# Patient Record
Sex: Male | Born: 1965 | Race: Black or African American | Hispanic: No | Marital: Married | Smoking: Current some day smoker
Health system: Southern US, Community
[De-identification: ages and names within clinical notes are randomized; demographics above are authoritative.]

## PROBLEM LIST (undated history)

## (undated) DIAGNOSIS — I10 Essential (primary) hypertension: Secondary | ICD-10-CM

## (undated) HISTORY — PX: OTHER SURGICAL HISTORY: SHX169

---

## 2004-09-13 ENCOUNTER — Emergency Department (HOSPITAL_COMMUNITY): Admission: EM | Admit: 2004-09-13 | Discharge: 2004-09-14 | Payer: Self-pay | Admitting: Emergency Medicine

## 2005-08-30 ENCOUNTER — Emergency Department (HOSPITAL_COMMUNITY): Admission: EM | Admit: 2005-08-30 | Discharge: 2005-08-30 | Payer: Self-pay | Admitting: Family Medicine

## 2006-09-12 ENCOUNTER — Emergency Department (HOSPITAL_COMMUNITY): Admission: EM | Admit: 2006-09-12 | Discharge: 2006-09-12 | Payer: Self-pay | Admitting: *Deleted

## 2007-08-06 ENCOUNTER — Emergency Department (HOSPITAL_COMMUNITY): Admission: EM | Admit: 2007-08-06 | Discharge: 2007-08-06 | Payer: Self-pay | Admitting: Emergency Medicine

## 2008-04-27 ENCOUNTER — Emergency Department (HOSPITAL_COMMUNITY): Admission: EM | Admit: 2008-04-27 | Discharge: 2008-04-27 | Payer: Self-pay | Admitting: Internal Medicine

## 2010-05-25 LAB — WOUND CULTURE

## 2010-10-11 ENCOUNTER — Emergency Department (HOSPITAL_COMMUNITY)
Admission: EM | Admit: 2010-10-11 | Discharge: 2010-10-11 | Discharge status: Against Medical Advice | Payer: Self-pay | Attending: Emergency Medicine | Admitting: Emergency Medicine

## 2010-10-11 ENCOUNTER — Emergency Department (HOSPITAL_COMMUNITY): Payer: Self-pay

## 2010-10-11 DIAGNOSIS — R0789 Other chest pain: Secondary | ICD-10-CM | POA: Insufficient documentation

## 2010-10-11 DIAGNOSIS — R0602 Shortness of breath: Secondary | ICD-10-CM | POA: Insufficient documentation

## 2010-10-11 DIAGNOSIS — R252 Cramp and spasm: Secondary | ICD-10-CM | POA: Insufficient documentation

## 2010-10-11 LAB — CBC
Hemoglobin: 14.2 g/dL (ref 13.0–17.0)
MCH: 31.4 pg (ref 26.0–34.0)
MCV: 92.3 fL (ref 78.0–100.0)
RBC: 4.52 MIL/uL (ref 4.22–5.81)
WBC: 13.6 10*3/uL — ABNORMAL HIGH (ref 4.0–10.5)

## 2010-10-11 LAB — CK TOTAL AND CKMB (NOT AT ARMC)
CK, MB: 7.8 ng/mL (ref 0.3–4.0)
Total CK: 557 U/L — ABNORMAL HIGH (ref 7–232)

## 2010-10-11 LAB — POCT I-STAT, CHEM 8
BUN: 12 mg/dL (ref 6–23)
Chloride: 105 mEq/L (ref 96–112)
Potassium: 3.6 mEq/L (ref 3.5–5.1)
Sodium: 140 mEq/L (ref 135–145)

## 2010-10-11 LAB — MAGNESIUM: Magnesium: 2.1 mg/dL (ref 1.5–2.5)

## 2010-10-11 LAB — DIFFERENTIAL
Lymphocytes Relative: 17 % (ref 12–46)
Lymphs Abs: 2.3 10*3/uL (ref 0.7–4.0)
Monocytes Relative: 6 % (ref 3–12)
Neutro Abs: 10.3 10*3/uL — ABNORMAL HIGH (ref 1.7–7.7)
Neutrophils Relative %: 76 % (ref 43–77)

## 2010-10-11 LAB — RAPID URINE DRUG SCREEN, HOSP PERFORMED
Barbiturates: NOT DETECTED
Cocaine: NOT DETECTED
Opiates: NOT DETECTED

## 2010-11-09 LAB — POCT I-STAT, CHEM 8
BUN: 21
Calcium, Ion: 1.18
Chloride: 106
Glucose, Bld: 107 — ABNORMAL HIGH
HCT: 48
Potassium: 3.7

## 2010-11-09 LAB — URINALYSIS, ROUTINE W REFLEX MICROSCOPIC
Nitrite: NEGATIVE
Specific Gravity, Urine: 1.026
Urobilinogen, UA: 0.2
pH: 5

## 2010-11-09 LAB — URINE MICROSCOPIC-ADD ON

## 2010-11-09 LAB — CK: Total CK: 422 — ABNORMAL HIGH

## 2010-11-27 LAB — I-STAT 8, (EC8 V) (CONVERTED LAB)
Acid-base deficit: 2
BUN: 24 — ABNORMAL HIGH
Chloride: 107
Glucose, Bld: 112 — ABNORMAL HIGH
Potassium: 4.2
pCO2, Ven: 46.4
pH, Ven: 7.329 — ABNORMAL HIGH

## 2010-11-27 LAB — URINALYSIS, ROUTINE W REFLEX MICROSCOPIC
Bilirubin Urine: NEGATIVE
Leukocytes, UA: NEGATIVE
Nitrite: NEGATIVE
Specific Gravity, Urine: 1.034 — ABNORMAL HIGH
Urobilinogen, UA: 0.2
pH: 5

## 2010-11-27 LAB — POCT I-STAT CREATININE: Creatinine, Ser: 2.2 — ABNORMAL HIGH

## 2010-11-27 LAB — CK: Total CK: 673 — ABNORMAL HIGH

## 2010-11-27 LAB — URINE MICROSCOPIC-ADD ON

## 2012-11-06 ENCOUNTER — Emergency Department (HOSPITAL_COMMUNITY): Payer: Self-pay

## 2012-11-06 ENCOUNTER — Encounter (HOSPITAL_COMMUNITY): Payer: Self-pay | Admitting: Emergency Medicine

## 2012-11-06 ENCOUNTER — Emergency Department (HOSPITAL_COMMUNITY)
Admission: EM | Admit: 2012-11-06 | Discharge: 2012-11-07 | Disposition: A | Discharge status: Home / Self Care | Payer: Self-pay | Attending: Emergency Medicine | Admitting: Emergency Medicine

## 2012-11-06 DIAGNOSIS — R61 Generalized hyperhidrosis: Secondary | ICD-10-CM | POA: Insufficient documentation

## 2012-11-06 DIAGNOSIS — E860 Dehydration: Secondary | ICD-10-CM | POA: Diagnosis present

## 2012-11-06 DIAGNOSIS — R0602 Shortness of breath: Secondary | ICD-10-CM | POA: Insufficient documentation

## 2012-11-06 DIAGNOSIS — F17200 Nicotine dependence, unspecified, uncomplicated: Secondary | ICD-10-CM | POA: Insufficient documentation

## 2012-11-06 DIAGNOSIS — R252 Cramp and spasm: Secondary | ICD-10-CM | POA: Diagnosis present

## 2012-11-06 LAB — BASIC METABOLIC PANEL
BUN: 17 mg/dL (ref 6–23)
GFR calc Af Amer: 72 mL/min — ABNORMAL LOW (ref >90)
GFR calc non Af Amer: 62 mL/min — ABNORMAL LOW (ref >90)
Glucose, Bld: 115 mg/dL — ABNORMAL HIGH (ref 70–99)
Potassium: 3.8 mEq/L (ref 3.5–5.1)
Sodium: 137 mEq/L (ref 135–145)

## 2012-11-06 LAB — POCT I-STAT TROPONIN I: Troponin i, poc: 0 ng/mL (ref 0.00–0.08)

## 2012-11-06 LAB — CK: Total CK: 682 U/L — ABNORMAL HIGH (ref 7–232)

## 2012-11-06 LAB — CBC
Hemoglobin: 14.7 g/dL (ref 13.0–17.0)
MCH: 32.7 pg (ref 26.0–34.0)
MCHC: 35.5 g/dL (ref 30.0–36.0)
Platelets: 228 10*3/uL (ref 150–400)
WBC: 16.5 10*3/uL — ABNORMAL HIGH (ref 4.0–10.5)

## 2012-11-06 LAB — PRO B NATRIURETIC PEPTIDE: Pro B Natriuretic peptide (BNP): 135.5 pg/mL — ABNORMAL HIGH (ref 0–125)

## 2012-11-06 MED ORDER — ASPIRIN 325 MG PO TABS
325.0000 mg | ORAL_TABLET | ORAL | Status: AC
  Administered 2012-11-06: 325 mg via ORAL
  Filled 2012-11-06: qty 1

## 2012-11-06 MED ORDER — LORAZEPAM 2 MG/ML IJ SOLN
1.0000 mg | Freq: Once | INTRAMUSCULAR | Status: AC
  Administered 2012-11-06: 1 mg via INTRAVENOUS
  Filled 2012-11-06: qty 1

## 2012-11-06 MED ORDER — NITROGLYCERIN 0.4 MG SL SUBL: 0.4000 mg | SUBLINGUAL_TABLET | SUBLINGUAL | Status: DC | PRN

## 2012-11-06 MED ORDER — SODIUM CHLORIDE 0.9 % IV BOLUS (SEPSIS)
1000.0000 mL | INTRAVENOUS | Status: AC
  Administered 2012-11-06: 1000 mL via INTRAVENOUS

## 2012-11-06 NOTE — ED Notes (Signed)
Pt writhing all over room, beads of perspiration
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
## 2012-11-06 NOTE — ED Notes (Signed)
Patient transported to X-ray 

## 2012-11-06 NOTE — ED Notes (Signed)
Pt. reports left lower chest pain onset 7 pm this evening with SOB , occasional dry cough and nausea , denies emesis or diaphoresis .

## 2012-11-07 DIAGNOSIS — R252 Cramp and spasm: Secondary | ICD-10-CM | POA: Diagnosis present

## 2014-09-27 ENCOUNTER — Emergency Department (HOSPITAL_COMMUNITY): Payer: Self-pay

## 2014-09-27 ENCOUNTER — Emergency Department (HOSPITAL_COMMUNITY)
Admission: EM | Admit: 2014-09-27 | Discharge: 2014-09-27 | Disposition: A | Discharge status: Home / Self Care | Payer: Self-pay | Attending: Emergency Medicine | Admitting: Emergency Medicine

## 2014-09-27 ENCOUNTER — Encounter (HOSPITAL_COMMUNITY): Payer: Self-pay | Admitting: *Deleted

## 2014-09-27 DIAGNOSIS — Z720 Tobacco use: Secondary | ICD-10-CM | POA: Insufficient documentation

## 2014-09-27 DIAGNOSIS — R52 Pain, unspecified: Secondary | ICD-10-CM

## 2014-09-27 DIAGNOSIS — N503 Cyst of epididymis: Secondary | ICD-10-CM | POA: Insufficient documentation

## 2014-09-27 LAB — URINALYSIS, ROUTINE W REFLEX MICROSCOPIC
Bilirubin Urine: NEGATIVE
Glucose, UA: NEGATIVE mg/dL
HGB URINE DIPSTICK: NEGATIVE
Ketones, ur: NEGATIVE mg/dL
Leukocytes, UA: NEGATIVE
NITRITE: NEGATIVE
PROTEIN: NEGATIVE mg/dL
Specific Gravity, Urine: 1.028 (ref 1.005–1.030)
UROBILINOGEN UA: 0.2 mg/dL (ref 0.0–1.0)
pH: 5.5 (ref 5.0–8.0)

## 2014-09-27 NOTE — ED Notes (Signed)
Pt reports having a "knot" on his testicles x 1 week, causing swelling and pain.

## 2014-09-27 NOTE — Discharge Instructions (Signed)
Scrotal Masses Scrotal swelling is common in men of all ages. Common types of testicular masses include:   Hydrocele. The most common benign testicular mass in an adult. Hydroceles are generally soft and painless collections of fluid in the scrotal sac. These can rapidly change size as the fluid enters or leaves. Hydroceles can be associated with an underlying cancer of the testicle.  Spermatoceles. Generally soft and painless cyst-like masses in the scrotum that contain fluid, usually above the testicle. They can rapidly change size as the fluid enters or leaves. They are more prominent while standing or exercising. Sometimes, spermatoceles may cause a sensation of heaviness or a dull ache.  Orchitis. Inflammation of the testicle. It is painful and may be associated with a fever or symptoms of a urinary tract infection, including frequent and painful urination. It is common in males who have the mumps.  Varicocele. An enlargement of the veins that drain the testicles. Varicoceles usually occur on the left side of the scrotum. This condition can increase the risk of infertility. Varicocele is sometimes more prominent while standing or exercising. Sometimes, varicoceles may cause a sensation of heaviness or a dull ache.  Inguinal hernia. A bulge caused by a portion of intestine protruding into the scrotum through a weak area in the abdominal muscles. Hernias may or may not be painful. They are soft and usually enlarge with coughing or straining.  Torsion of the testis. This can cause a testicular mass that develops quickly and is associated with tenderness or fever, or both. It is caused by a twisting of the testicle within the sac. It also reduces the blood supply and can destroy the testis if not treated quickly with surgery.  Epididymitis. Inflammation of the epididymis (a structure attached above and behind the testicle), usually caused by a urinary tract infection or a sexually transmitted  infection. This generally shows up as testicular discomfort and swelling and may include pain during urination. It is frequently associated with a testicle infection.  Testicular appendages. Remnants of tissue on the testis present since birth. A testicular appendage can twist on its blood supply and cause pain. In most cases, this is seen as a blue dot on the scrotum.  Hematocele. A collection of blood between the layers of the sac inside the scrotum. It usually is caused by trauma to the scrotum.  Sebaceous cysts. These can be a swelling in the skin of the scrotum and are usually painless.  Cancer (carcinoma) of the skin of the scrotum. It can cause scrotal swelling, but this is rare. Document Released: 08/05/2002 Document Revised: 10/01/2012 Document Reviewed: 07/21/2012 Exit[employer] Patient Information 2015 [employer]. This information is not intended to replace advice given to you by your health care provider. Make sure you discuss any questions you have with your health care provider.  
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
## 2017-05-01 ENCOUNTER — Ambulatory Visit: Payer: Self-pay | Admitting: Family Medicine

## 2019-05-06 ENCOUNTER — Other Ambulatory Visit: Payer: Self-pay

## 2019-05-06 ENCOUNTER — Ambulatory Visit (HOSPITAL_COMMUNITY)
Admission: EM | Admit: 2019-05-06 | Discharge: 2019-05-06 | Disposition: A | Discharge status: Home / Self Care | Payer: 59 | Attending: Emergency Medicine | Admitting: Emergency Medicine

## 2019-05-06 ENCOUNTER — Encounter (HOSPITAL_COMMUNITY): Payer: Self-pay | Admitting: Emergency Medicine

## 2019-05-06 DIAGNOSIS — H1132 Conjunctival hemorrhage, left eye: Secondary | ICD-10-CM

## 2019-05-06 DIAGNOSIS — R030 Elevated blood-pressure reading, without diagnosis of hypertension: Secondary | ICD-10-CM | POA: Diagnosis not present

## 2019-05-06 DIAGNOSIS — G44201 Tension-type headache, unspecified, intractable: Secondary | ICD-10-CM

## 2019-05-06 MED ORDER — NAPROXEN 500 MG PO TABS: 500.0000 mg | ORAL_TABLET | Freq: Two times a day (BID) | ORAL | 0 refills | Status: DC | PRN

## 2019-05-06 MED ORDER — AMLODIPINE BESYLATE 5 MG PO TABS: 5.0000 mg | ORAL_TABLET | Freq: Every day | ORAL | 0 refills | Status: DC

## 2019-05-06 MED ORDER — OLOPATADINE HCL 0.1 % OP SOLN: 1.0000 [drp] | Freq: Two times a day (BID) | OPHTHALMIC | 0 refills | Status: DC

## 2019-05-06 NOTE — Discharge Instructions (Addendum)
Begin amlodipine 5 mg daily Please monitor your blood pressure at home if you are able Please call and set up follow-up appointment with primary care in the next 2 to 4 weeks May use olopatadine as needed for itching twice daily in eye; I expect redness to gradually resolve May use Naprosyn twice daily as needed with food for headaches  Please go to Emergency Room if you start to experience severe headache, vision changes, decreased urine production, chest pain, shortness of breath, speech slurring, one sided weakness.

## 2019-05-06 NOTE — ED Triage Notes (Signed)
PT woke up with burst blood vessels in left eye. He reports headaches and lightheadedness for months. Has not seen a doctor in more than 5 years. Hypertensive in triage, denies chest pain, shortness of breath.
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
## 2019-05-14 ENCOUNTER — Ambulatory Visit: Payer: 59 | Attending: Internal Medicine

## 2019-05-14 DIAGNOSIS — Z23 Encounter for immunization: Secondary | ICD-10-CM

## 2019-06-08 ENCOUNTER — Ambulatory Visit: Payer: 59 | Attending: Internal Medicine

## 2019-06-08 DIAGNOSIS — Z23 Encounter for immunization: Secondary | ICD-10-CM

## 2019-06-08 NOTE — Progress Notes (Signed)
   Covid-19 Vaccination Clinic  Name:  Grant Huff    MRN: [id number] DOB: [birth year]  06/08/2019  Grant Huff was observed post Covid-19 immunization for 30 minutes based on pre-vaccination screening without incident. He was provided with Vaccine Information Sheet and instruction to access the V-Safe system.   Grant Huff was instructed to call 911 with any severe reactions post vaccine: Marland Kitchen Difficulty breathing  . Swelling of face and throat  . A fast heartbeat  . A bad rash all over body  . Dizziness and weakness   Immunizations Administered    Name Date Dose VIS Date Route   Pfizer COVID-19 Vaccine 06/08/2019  9:04 AM 0.3 mL 04/08/2018 Intramuscular   Manufacturer: [employer], [employer]   Lot: [id number]   NDC: [address]-5

## 2019-06-09 ENCOUNTER — Emergency Department (HOSPITAL_COMMUNITY): Payer: 59

## 2019-06-09 ENCOUNTER — Emergency Department (HOSPITAL_COMMUNITY)
Admission: EM | Admit: 2019-06-09 | Discharge: 2019-06-09 | Disposition: A | Discharge status: Home / Self Care | Payer: 59 | Attending: Emergency Medicine | Admitting: Emergency Medicine

## 2019-06-09 ENCOUNTER — Other Ambulatory Visit: Payer: Self-pay

## 2019-06-09 ENCOUNTER — Encounter (HOSPITAL_COMMUNITY): Payer: Self-pay | Admitting: Emergency Medicine

## 2019-06-09 DIAGNOSIS — I10 Essential (primary) hypertension: Secondary | ICD-10-CM | POA: Diagnosis not present

## 2019-06-09 DIAGNOSIS — F17210 Nicotine dependence, cigarettes, uncomplicated: Secondary | ICD-10-CM | POA: Diagnosis not present

## 2019-06-09 DIAGNOSIS — R519 Headache, unspecified: Secondary | ICD-10-CM | POA: Diagnosis not present

## 2019-06-09 DIAGNOSIS — R0602 Shortness of breath: Secondary | ICD-10-CM | POA: Diagnosis not present

## 2019-06-09 DIAGNOSIS — T50Z95A Adverse effect of other vaccines and biological substances, initial encounter: Secondary | ICD-10-CM | POA: Insufficient documentation

## 2019-06-09 LAB — BASIC METABOLIC PANEL
Anion gap: 11 (ref 5–15)
BUN: 9 mg/dL (ref 6–20)
CO2: 23 mmol/L (ref 22–32)
Calcium: 8.8 mg/dL — ABNORMAL LOW (ref 8.9–10.3)
Chloride: 108 mmol/L (ref 98–111)
Creatinine, Ser: 1.18 mg/dL (ref 0.61–1.24)
GFR calc Af Amer: >60 mL/min (ref >60)
GFR calc non Af Amer: >60 mL/min (ref >60)
Glucose, Bld: 108 mg/dL — ABNORMAL HIGH (ref 70–99)
Potassium: 3.9 mmol/L (ref 3.5–5.1)
Sodium: 142 mmol/L (ref 135–145)

## 2019-06-09 LAB — TROPONIN I (HIGH SENSITIVITY)
Troponin I (High Sensitivity): 33 ng/L — ABNORMAL HIGH (ref <18)
Troponin I (High Sensitivity): 31 ng/L — ABNORMAL HIGH (ref <18)

## 2019-06-09 LAB — CBC
HCT: 43.1 % (ref 39.0–52.0)
Hemoglobin: 14.2 g/dL (ref 13.0–17.0)
MCH: 33.6 pg (ref 26.0–34.0)
MCHC: 32.9 g/dL (ref 30.0–36.0)
MCV: 102.1 fL — ABNORMAL HIGH (ref 80.0–100.0)
Platelets: 237 10*3/uL (ref 150–400)
RBC: 4.22 MIL/uL (ref 4.22–5.81)
RDW: 14.8 % (ref 11.5–15.5)
WBC: 8.5 10*3/uL (ref 4.0–10.5)
nRBC: 0 % (ref 0.0–0.2)

## 2019-06-09 MED ORDER — ALBUTEROL SULFATE HFA 108 (90 BASE) MCG/ACT IN AERS
2.0000 | INHALATION_SPRAY | RESPIRATORY_TRACT | Status: DC | PRN
  Administered 2019-06-09: 2 via RESPIRATORY_TRACT
  Filled 2019-06-09: qty 6.7

## 2019-06-09 MED ORDER — HYDROCHLOROTHIAZIDE 25 MG PO TABS
25.0000 mg | ORAL_TABLET | Freq: Every day | ORAL | 1 refills | Status: DC
Start: 2019-06-09 — End: 2019-09-08

## 2019-06-09 MED ORDER — IOHEXOL 350 MG/ML SOLN
75.0000 mL | Freq: Once | INTRAVENOUS | Status: AC | PRN
  Administered 2019-06-09: 75 mL via INTRAVENOUS

## 2019-06-09 MED ORDER — ACETAMINOPHEN 500 MG PO TABS
1000.0000 mg | ORAL_TABLET | Freq: Once | ORAL | Status: AC
  Administered 2019-06-09: 1000 mg via ORAL
  Filled 2019-06-09: qty 2

## 2019-06-09 MED ORDER — SODIUM CHLORIDE 0.9% FLUSH: 3.0000 mL | Freq: Once | INTRAVENOUS | Status: DC

## 2019-06-09 NOTE — ED Provider Notes (Signed)
[facility] EMERGENCY DEPARTMENT Provider Note   CSN: [id number] Arrival date & time: 06/09/19  2641     History Chief Complaint  Patient presents with  . Chest Pain  . Headache    Grant Huff is a 54 y.o. male.  Patient is a 54 year old male with a history of hypertension, daily tobacco use who is presenting today with multiple symptoms.  Patient reports that he received his second Covid vaccine yesterday and by yesterday evening he was having a severe headache, generalized chest discomfort, difficulty taking a deep breath and some mild pleuritic chest pain.  He has not had fever, nausea or vomiting.  Patient reports that approximately 3 weeks ago he was seen at urgent care because he was having dizzy spells at work and intermittent chest tightness and was found to have high blood pressure.  He was started on amlodipine but reports he stopped that medication 3 days ago because it was causing severe diarrhea.  He reports no prior medical history but had not seen a doctor in quite some time.  He does have intermittent cough that is nonproductive and states that he does intermittently wheeze.  He does not use an inhaler at home.  The history is provided by the patient.  Chest Pain Associated symptoms: headache   Headache      History reviewed. No pertinent past medical history.  There are no problems to display for this patient.   History reviewed. No pertinent surgical history.     No family history on file.  Social History   Tobacco Use  . Smoking status: Current Some Day Smoker  . Smokeless tobacco: Never Used  Substance Use Topics  . Alcohol use: Yes  . Drug use: Yes    Types: Marijuana    Home Medications Prior to Admission medications   Medication Sig Start Date End Date Taking? Authorizing Provider  amLODipine (NORVASC) 5 MG tablet Take 5 mg by mouth daily. 05/06/19  Yes [provider]  aspirin 325 MG EC tablet Take 325 mg by  mouth every 6 (six) hours as needed for pain.    Yes [provider]  naproxen (NAPROSYN) 500 MG tablet Take 500 mg by mouth 2 (two) times daily as needed. 05/06/19  Yes [provider]    Allergies    Bee venom  Review of Systems   Review of Systems  Cardiovascular: Positive for chest pain.  Neurological: Positive for headaches.  All other systems reviewed and are negative.   Physical Exam Updated Vital Signs BP (!) 171/103 (BP Location: Right Arm)   Pulse 78   Temp 98 F (36.7 C) (Oral)   Resp 16   Wt 90.7 kg   SpO2 98%   Physical Exam Vitals and nursing note reviewed.  Constitutional:      General: He is not in acute distress.    Appearance: Normal appearance. He is well-developed and normal weight.  HENT:     Head: Normocephalic and atraumatic.     Nose: Congestion present.     Mouth/Throat:     Mouth: Mucous membranes are moist.  Eyes:     Conjunctiva/sclera: Conjunctivae normal.     Pupils: Pupils are equal, round, and reactive to light.  Cardiovascular:     Rate and Rhythm: Normal rate and regular rhythm.     Heart sounds: No murmur.  Pulmonary:     Effort: Pulmonary effort is normal. No tachypnea or respiratory distress.     Breath  sounds: Examination of the right-lower field reveals wheezing. Examination of the left-lower field reveals wheezing. Wheezing present. No rhonchi or rales.  Abdominal:     General: There is no distension.     Palpations: Abdomen is soft.     Tenderness: There is no abdominal tenderness. There is no guarding or rebound.  Musculoskeletal:        General: No tenderness. Normal range of motion.     Cervical back: Normal range of motion and neck supple.  Skin:    General: Skin is warm and dry.     Capillary Refill: Capillary refill takes less than 2 seconds.     Findings: No erythema or rash.  Neurological:     General: No focal deficit present.     Mental Status: He is alert and oriented to person, place, and  time. Mental status is at baseline.  Psychiatric:        Mood and Affect: Mood normal.        Behavior: Behavior normal.        Thought Content: Thought content normal.      ED Results / Procedures / Treatments   Labs (all labs ordered are listed, but only abnormal results are displayed) Labs Reviewed  BASIC METABOLIC PANEL - Abnormal; Notable for the following components:      Result Value   Glucose, Bld 108 (*)    Calcium 8.8 (*)    All other components within normal limits  CBC - Abnormal; Notable for the following components:   MCV 102.1 (*)    All other components within normal limits  TROPONIN I (HIGH SENSITIVITY) - Abnormal; Notable for the following components:   Troponin I (High Sensitivity) 33 (*)    All other components within normal limits  TROPONIN I (HIGH SENSITIVITY) - Abnormal; Notable for the following components:   Troponin I (High Sensitivity) 31 (*)    All other components within normal limits    EKG EKG Interpretation  Date/Time:  Tuesday June 09 2019 09:49:31 EDT Ventricular Rate:  81 PR Interval:  158 QRS Duration: 123 QT Interval:  393 QTC Calculation: 457 R Axis:   80 Text Interpretation: Sinus rhythm Consider right atrial enlargement LVH with IVCD and secondary repol abnrm Anterior ST elevation, probably due to LVH No significant change since last tracing Confirmed by Gwyneth Sprout ([id number]) on 06/09/2019 11:09:46 AM   Radiology DG Chest 2 View  Result Date: 06/09/2019 CLINICAL DATA:  Chest pain EXAM: CHEST - 2 VIEW COMPARISON:  11/06/2012 FINDINGS: Normal heart size and unremarkable aortic contours. There is no edema, consolidation, effusion, or pneumothorax. Smooth nodular density at the left apex measuring 16 mm, likely in line with the trachea on the lateral view. IMPRESSION: 16 mm left apical nodule, recommend chest CT. Electronically Signed   By: Marnee Spring M.D.   On: 06/09/2019 05:59    Procedures Procedures (including critical  care time)  Medications Ordered in ED Medications  sodium chloride flush (NS) 0.9 % injection 3 mL (0 mLs Intravenous Hold 06/09/19 0927)  albuterol (VENTOLIN HFA) 108 (90 Base) MCG/ACT inhaler 2 puff (2 puffs Inhalation Given 06/09/19 1020)  acetaminophen (TYLENOL) tablet 1,000 mg (1,000 mg Oral Given 06/09/19 1020)  iohexol (OMNIPAQUE) 350 MG/ML injection 75 mL (75 mLs Intravenous Contrast Given 06/09/19 1058)    ED Course  I have reviewed the triage vital signs and the nursing notes.  Pertinent labs & imaging results that were available during my care of the patient  were reviewed by me and considered in my medical decision making (see chart for details).    MDM Rules/Calculators/A&P                      Patient is a 54 year old male presenting with multiple complaints.  Atypical chest pain that occurred last night with a headache approximately 10 to 12 hours after getting his second Covid vaccine.  He is still complaining of a headache and just general tightness in his chest reporting that he feels that he cannot take a deep breath.  Patient has some mild wheezing on exam also a history of tobacco abuse.  Secondly patient is hypertensive here and reports that he was placed on amlodipine 3 weeks ago but due to diarrhea stopped it over the weekend.  EKG is consistent with LVH which there is no prior to compare but is unchanged between the 2 EKGs we have had here.  Patient does mild elevation of troponin at 33 but delta troponin is 31 and feel this is most likely his baseline due to his chronic untreated hypertension and LVH.  His symptoms are not exertional they are not localized and they are not typical of ACS.  Patient's BMP without acute findings and CBC is within normal limits.  Patient's chest x-ray does show a nodule and given his smoking history we will do a CT to further evaluate.  Given patient just recently had his Covid vaccines and does have a pleuritic component to his pain we will do a  CTA to rule out PE.  Patient was also given Tylenol for his headache and an albuterol inhaler to see if that helps with his chest tightness.  Long-term he will need blood pressure treatment as well.  11:56 AM CT is negative for acute pathology.  Patient's breath sounds are improved after inhaler and he was discharged home with the inhaler to use as needed for wheezing.  Also because he was not tolerating the amlodipine we will start him on hydrochlorothiazide and will get him follow-up with the PCP.  Otherwise feel that patient symptoms mostly today are related to his vaccine yesterday.  MDM Number of Diagnoses or Management Options   Amount and/or Complexity of Data Reviewed Clinical lab tests: ordered and reviewed Tests in the radiology section of CPT: ordered and reviewed Tests in the medicine section of CPT: ordered and reviewed Decide to obtain previous medical records or to obtain history from someone other than the patient: yes Obtain history from someone other than the patient: yes Review and summarize past medical records: yes Discuss the patient with other providers: yes Independent visualization of images, tracings, or specimens: yes  Risk of Complications, Morbidity, and/or Mortality Presenting problems: moderate Diagnostic procedures: low Management options: low  Patient Progress Patient progress: stable  Final Clinical Impression(s) / ED Diagnoses Final diagnoses:  Hypertension, unspecified type  Adverse effect of vaccine, initial encounter    Rx / DC Orders ED Discharge Orders         Ordered    hydrochlorothiazide (HYDRODIURIL) 25 MG tablet  Daily     06/09/19 1159           Blanchie Dessert, MD 06/09/19 1159

## 2019-06-09 NOTE — Discharge Instructions (Signed)
Today at your CAT scan was normal without any blood clots or masses.  You do have high blood pressure that is causing strain to your heart and it will be important to start blood pressure medication.  You are probably feeling the way you are today because of your vaccine and that should improve within the next 24 hours.  You can take Tylenol drink fluids and rest.

## 2019-06-09 NOTE — ED Notes (Signed)
Pt taken to xray 

## 2019-06-09 NOTE — Progress Notes (Signed)
   06/09/19 1210  TOC ED Mini Assessment  TOC Time spent with patient (minutes): 20  PING Used in TOC Assessment No  Admission or Readmission Diverted Yes  Interventions which prevented an admission or readmission PCP Appointment Scheduled  What brought you to the Emergency Department?  chest pain and headache  Barriers to Discharge Barriers Resolved  Barrier interventions PCP appointment  Means of departure Car  Raven Harmes J. Lucretia Roers, RN, BSN, [place] [phone]  RNCM set up appointment with [facility] on 5/19@0930 .  Spoke with pt at bedside and advised to please arrive 15 min early and take a picture ID and your current medications.  Pt verbalizes understanding of keeping appointment.

## 2019-06-09 NOTE — ED Triage Notes (Addendum)
Pt in with c/o L chest tightness since last night. States a HA as well, and this all started around the same time. Denies any n/v or sob, but pain is worse w/deep breaths. Says he sometimes feels lightheaded. Says he got his 2nd covid vaccine yesterday. Pt also states new dx of HTN, started on new med recently

## 2019-06-15 ENCOUNTER — Encounter (HOSPITAL_COMMUNITY): Payer: Self-pay | Admitting: Emergency Medicine

## 2019-06-15 ENCOUNTER — Ambulatory Visit (HOSPITAL_COMMUNITY)
Admission: EM | Admit: 2019-06-15 | Discharge: 2019-06-15 | Disposition: A | Discharge status: Home / Self Care | Payer: 59 | Source: Home / Self Care

## 2019-06-15 ENCOUNTER — Emergency Department (HOSPITAL_COMMUNITY)
Admission: EM | Admit: 2019-06-15 | Discharge: 2019-06-15 | Disposition: A | Discharge status: Home / Self Care | Payer: 59 | Attending: Emergency Medicine | Admitting: Emergency Medicine

## 2019-06-15 ENCOUNTER — Emergency Department (HOSPITAL_COMMUNITY): Payer: 59

## 2019-06-15 ENCOUNTER — Telehealth (HOSPITAL_COMMUNITY): Payer: Self-pay | Admitting: Urgent Care

## 2019-06-15 ENCOUNTER — Other Ambulatory Visit: Payer: Self-pay

## 2019-06-15 DIAGNOSIS — Z7982 Long term (current) use of aspirin: Secondary | ICD-10-CM | POA: Insufficient documentation

## 2019-06-15 DIAGNOSIS — R42 Dizziness and giddiness: Secondary | ICD-10-CM

## 2019-06-15 DIAGNOSIS — R911 Solitary pulmonary nodule: Secondary | ICD-10-CM

## 2019-06-15 DIAGNOSIS — Z79899 Other long term (current) drug therapy: Secondary | ICD-10-CM | POA: Insufficient documentation

## 2019-06-15 DIAGNOSIS — I10 Essential (primary) hypertension: Secondary | ICD-10-CM | POA: Diagnosis not present

## 2019-06-15 DIAGNOSIS — F1210 Cannabis abuse, uncomplicated: Secondary | ICD-10-CM | POA: Insufficient documentation

## 2019-06-15 DIAGNOSIS — R519 Headache, unspecified: Secondary | ICD-10-CM

## 2019-06-15 DIAGNOSIS — R778 Other specified abnormalities of plasma proteins: Secondary | ICD-10-CM

## 2019-06-15 DIAGNOSIS — R29818 Other symptoms and signs involving the nervous system: Secondary | ICD-10-CM

## 2019-06-15 DIAGNOSIS — R030 Elevated blood-pressure reading, without diagnosis of hypertension: Secondary | ICD-10-CM

## 2019-06-15 DIAGNOSIS — F17210 Nicotine dependence, cigarettes, uncomplicated: Secondary | ICD-10-CM | POA: Insufficient documentation

## 2019-06-15 DIAGNOSIS — F17200 Nicotine dependence, unspecified, uncomplicated: Secondary | ICD-10-CM

## 2019-06-15 LAB — PROTIME-INR
INR: 1.1 (ref 0.8–1.2)
Prothrombin Time: 13.5 seconds (ref 11.4–15.2)

## 2019-06-15 LAB — APTT: aPTT: 31 seconds (ref 24–36)

## 2019-06-15 LAB — CBC
HCT: 46.8 % (ref 39.0–52.0)
Hemoglobin: 15.4 g/dL (ref 13.0–17.0)
MCH: 33.6 pg (ref 26.0–34.0)
MCHC: 32.9 g/dL (ref 30.0–36.0)
MCV: 102.2 fL — ABNORMAL HIGH (ref 80.0–100.0)
Platelets: 259 10*3/uL (ref 150–400)
RBC: 4.58 MIL/uL (ref 4.22–5.81)
RDW: 14.6 % (ref 11.5–15.5)
WBC: 8.4 10*3/uL (ref 4.0–10.5)
nRBC: 0 % (ref 0.0–0.2)

## 2019-06-15 LAB — DIFFERENTIAL
Abs Immature Granulocytes: 0.02 10*3/uL (ref 0.00–0.07)
Basophils Absolute: 0.1 10*3/uL (ref 0.0–0.1)
Basophils Relative: 1 %
Eosinophils Absolute: 0.1 10*3/uL (ref 0.0–0.5)
Eosinophils Relative: 1 %
Immature Granulocytes: 0 %
Lymphocytes Relative: 37 %
Lymphs Abs: 3.1 10*3/uL (ref 0.7–4.0)
Monocytes Absolute: 0.7 10*3/uL (ref 0.1–1.0)
Monocytes Relative: 8 %
Neutro Abs: 4.5 10*3/uL (ref 1.7–7.7)
Neutrophils Relative %: 53 %

## 2019-06-15 LAB — I-STAT CHEM 8, ED
BUN: 14 mg/dL (ref 6–20)
Calcium, Ion: 1.17 mmol/L (ref 1.15–1.40)
Chloride: 105 mmol/L (ref 98–111)
Creatinine, Ser: 1.3 mg/dL — ABNORMAL HIGH (ref 0.61–1.24)
Glucose, Bld: 94 mg/dL (ref 70–99)
HCT: 47 % (ref 39.0–52.0)
Hemoglobin: 16 g/dL (ref 13.0–17.0)
Potassium: 3.8 mmol/L (ref 3.5–5.1)
Sodium: 140 mmol/L (ref 135–145)
TCO2: 24 mmol/L (ref 22–32)

## 2019-06-15 LAB — COMPREHENSIVE METABOLIC PANEL
ALT: 17 U/L (ref 0–44)
AST: 30 U/L (ref 15–41)
Albumin: 3.9 g/dL (ref 3.5–5.0)
Alkaline Phosphatase: 68 U/L (ref 38–126)
Anion gap: 11 (ref 5–15)
BUN: 13 mg/dL (ref 6–20)
CO2: 23 mmol/L (ref 22–32)
Calcium: 9 mg/dL (ref 8.9–10.3)
Chloride: 106 mmol/L (ref 98–111)
Creatinine, Ser: 1.3 mg/dL — ABNORMAL HIGH (ref 0.61–1.24)
GFR calc Af Amer: >60 mL/min (ref >60)
GFR calc non Af Amer: >60 mL/min (ref >60)
Glucose, Bld: 94 mg/dL (ref 70–99)
Potassium: 4.2 mmol/L (ref 3.5–5.1)
Sodium: 140 mmol/L (ref 135–145)
Total Bilirubin: 1.1 mg/dL (ref 0.3–1.2)
Total Protein: 6.7 g/dL (ref 6.5–8.1)

## 2019-06-15 LAB — ETHANOL: Alcohol, Ethyl (B): <10 mg/dL (ref <10)

## 2019-06-15 NOTE — Telephone Encounter (Signed)
Opened in error

## 2019-06-15 NOTE — Discharge Instructions (Addendum)
Please report to the emergency room now to obtain a head CT scan and rule out a stroke, transient ischemic attack in light of your uncontrolled hypertension and severe headache.  Outside of today's need for a head CT, please contact Dr. Jacinto Halim, a heart doctor, to establish care and help with your heart. Please also contact [facility] to establish care with a regular care provider for your chronic conditions.
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
## 2019-06-15 NOTE — ED Triage Notes (Signed)
Pt c/o headache and dizziness since last night. Was here 1 month ago and started on BP meds.
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
## 2019-06-15 NOTE — Discharge Instructions (Addendum)
It is very important that you are taking your blood pressure medications as prescribed. Avoid things that may cause blood pressure to be up, such as salty foods and tobacco.  Follow-up with the primary care doctor and cardiologist recommended by urgent care.  Return to the emergency room immediately if you develop vision changes, slurred speech, numbness/tingling, worsening dizziness/lightheadedness, or any new, worsening, or concerning symptoms.

## 2019-06-27 ENCOUNTER — Emergency Department (HOSPITAL_COMMUNITY)
Admission: EM | Admit: 2019-06-27 | Discharge: 2019-06-28 | Disposition: A | Discharge status: Home / Self Care | Payer: 59 | Attending: Emergency Medicine | Admitting: Emergency Medicine

## 2019-06-27 ENCOUNTER — Other Ambulatory Visit: Payer: Self-pay

## 2019-06-27 DIAGNOSIS — R519 Headache, unspecified: Secondary | ICD-10-CM | POA: Insufficient documentation

## 2019-06-27 DIAGNOSIS — Z79899 Other long term (current) drug therapy: Secondary | ICD-10-CM | POA: Diagnosis not present

## 2019-06-27 DIAGNOSIS — F17210 Nicotine dependence, cigarettes, uncomplicated: Secondary | ICD-10-CM | POA: Insufficient documentation

## 2019-06-27 DIAGNOSIS — R42 Dizziness and giddiness: Secondary | ICD-10-CM | POA: Insufficient documentation

## 2019-06-27 LAB — CBC
HCT: 45.4 % (ref 39.0–52.0)
Hemoglobin: 15.1 g/dL (ref 13.0–17.0)
MCH: 33.6 pg (ref 26.0–34.0)
MCHC: 33.3 g/dL (ref 30.0–36.0)
MCV: 100.9 fL — ABNORMAL HIGH (ref 80.0–100.0)
Platelets: 286 10*3/uL (ref 150–400)
RBC: 4.5 MIL/uL (ref 4.22–5.81)
RDW: 14.2 % (ref 11.5–15.5)
WBC: 7.5 10*3/uL (ref 4.0–10.5)
nRBC: 0 % (ref 0.0–0.2)

## 2019-06-27 LAB — BASIC METABOLIC PANEL
Anion gap: 8 (ref 5–15)
BUN: 14 mg/dL (ref 6–20)
CO2: 24 mmol/L (ref 22–32)
Calcium: 8.9 mg/dL (ref 8.9–10.3)
Chloride: 104 mmol/L (ref 98–111)
Creatinine, Ser: 1.31 mg/dL — ABNORMAL HIGH (ref 0.61–1.24)
GFR calc Af Amer: >60 mL/min (ref >60)
GFR calc non Af Amer: >60 mL/min (ref >60)
Glucose, Bld: 94 mg/dL (ref 70–99)
Potassium: 3.5 mmol/L (ref 3.5–5.1)
Sodium: 136 mmol/L (ref 135–145)

## 2019-06-27 MED ORDER — VALPROATE SODIUM 500 MG/5ML IV SOLN
500.0000 mg | Freq: Once | INTRAVENOUS | Status: AC
  Administered 2019-06-28: 500 mg via INTRAVENOUS
  Filled 2019-06-27: qty 5

## 2019-06-27 MED ORDER — DEXAMETHASONE SODIUM PHOSPHATE 10 MG/ML IJ SOLN
10.0000 mg | Freq: Once | INTRAMUSCULAR | Status: AC
  Administered 2019-06-28: 10 mg via INTRAVENOUS
  Filled 2019-06-27: qty 1

## 2019-06-27 MED ORDER — DIPHENHYDRAMINE HCL 50 MG/ML IJ SOLN
25.0000 mg | Freq: Once | INTRAMUSCULAR | Status: AC
  Administered 2019-06-28: 25 mg via INTRAVENOUS
  Filled 2019-06-27: qty 1

## 2019-06-27 MED ORDER — KETOROLAC TROMETHAMINE 30 MG/ML IJ SOLN
30.0000 mg | Freq: Once | INTRAMUSCULAR | Status: AC
  Administered 2019-06-28: 30 mg via INTRAVENOUS
  Filled 2019-06-27: qty 1

## 2019-06-27 MED ORDER — PROCHLORPERAZINE EDISYLATE 10 MG/2ML IJ SOLN
10.0000 mg | Freq: Once | INTRAMUSCULAR | Status: AC
  Administered 2019-06-28: 10 mg via INTRAVENOUS
  Filled 2019-06-27: qty 2

## 2019-06-27 MED ORDER — SODIUM CHLORIDE 0.9% FLUSH
3.0000 mL | Freq: Once | INTRAVENOUS | Status: AC
  Administered 2019-06-28: 3 mL via INTRAVENOUS

## 2019-06-27 NOTE — ED Triage Notes (Signed)
Onset today headache, lightheaded, blurred vision, and weakness.  Pt states he has been taking BP medications.

## 2019-06-28 ENCOUNTER — Emergency Department (HOSPITAL_COMMUNITY): Payer: 59

## 2019-06-28 LAB — URINALYSIS, ROUTINE W REFLEX MICROSCOPIC
Bilirubin Urine: NEGATIVE
Glucose, UA: NEGATIVE mg/dL
Hgb urine dipstick: NEGATIVE
Ketones, ur: NEGATIVE mg/dL
Leukocytes,Ua: NEGATIVE
Nitrite: NEGATIVE
Protein, ur: NEGATIVE mg/dL
Specific Gravity, Urine: 1.014 (ref 1.005–1.030)
pH: 5 (ref 5.0–8.0)

## 2019-06-28 LAB — CBG MONITORING, ED: Glucose-Capillary: 123 mg/dL — ABNORMAL HIGH (ref 70–99)

## 2019-06-28 MED ORDER — GADOBUTROL 1 MMOL/ML IV SOLN
10.0000 mL | Freq: Once | INTRAVENOUS | Status: AC | PRN
  Administered 2019-06-28: 10 mL via INTRAVENOUS

## 2019-06-28 NOTE — ED Notes (Signed)
ED Provider at bedside. 

## 2019-06-28 NOTE — Discharge Instructions (Addendum)
Please schedule follow-up with one of the listed neurology groups to further evaluate your ongoing headaches.

## 2019-06-28 NOTE — ED Notes (Signed)
Patient verbalized understanding of dc instructions, vss, ambulatory with nad.   
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
## 2019-07-01 ENCOUNTER — Inpatient Hospital Stay (INDEPENDENT_AMBULATORY_CARE_PROVIDER_SITE_OTHER): Payer: 59 | Admitting: Primary Care

## 2019-07-23 ENCOUNTER — Other Ambulatory Visit: Payer: Self-pay

## 2019-07-23 ENCOUNTER — Emergency Department (HOSPITAL_COMMUNITY): Payer: 59

## 2019-07-23 ENCOUNTER — Emergency Department (HOSPITAL_COMMUNITY)
Admission: EM | Admit: 2019-07-23 | Discharge: 2019-07-23 | Disposition: A | Discharge status: Home / Self Care | Payer: 59 | Attending: Emergency Medicine | Admitting: Emergency Medicine

## 2019-07-23 ENCOUNTER — Encounter (HOSPITAL_COMMUNITY): Payer: Self-pay | Admitting: Emergency Medicine

## 2019-07-23 DIAGNOSIS — Z79899 Other long term (current) drug therapy: Secondary | ICD-10-CM | POA: Insufficient documentation

## 2019-07-23 DIAGNOSIS — R079 Chest pain, unspecified: Secondary | ICD-10-CM | POA: Diagnosis not present

## 2019-07-23 DIAGNOSIS — R0602 Shortness of breath: Secondary | ICD-10-CM | POA: Insufficient documentation

## 2019-07-23 DIAGNOSIS — F17210 Nicotine dependence, cigarettes, uncomplicated: Secondary | ICD-10-CM | POA: Diagnosis not present

## 2019-07-23 DIAGNOSIS — R110 Nausea: Secondary | ICD-10-CM | POA: Insufficient documentation

## 2019-07-23 DIAGNOSIS — I10 Essential (primary) hypertension: Secondary | ICD-10-CM | POA: Insufficient documentation

## 2019-07-23 LAB — CBC
HCT: 42 % (ref 39.0–52.0)
Hemoglobin: 14 g/dL (ref 13.0–17.0)
MCH: 33.3 pg (ref 26.0–34.0)
MCHC: 33.3 g/dL (ref 30.0–36.0)
MCV: 100 fL (ref 80.0–100.0)
Platelets: 267 10*3/uL (ref 150–400)
RBC: 4.2 MIL/uL — ABNORMAL LOW (ref 4.22–5.81)
RDW: 13.7 % (ref 11.5–15.5)
WBC: 5.6 10*3/uL (ref 4.0–10.5)
nRBC: 0 % (ref 0.0–0.2)

## 2019-07-23 LAB — BASIC METABOLIC PANEL
Anion gap: 16 — ABNORMAL HIGH (ref 5–15)
BUN: 8 mg/dL (ref 6–20)
CO2: 21 mmol/L — ABNORMAL LOW (ref 22–32)
Calcium: 8.8 mg/dL — ABNORMAL LOW (ref 8.9–10.3)
Chloride: 107 mmol/L (ref 98–111)
Creatinine, Ser: 1.22 mg/dL (ref 0.61–1.24)
GFR calc Af Amer: >60 mL/min (ref >60)
GFR calc non Af Amer: >60 mL/min (ref >60)
Glucose, Bld: 101 mg/dL — ABNORMAL HIGH (ref 70–99)
Potassium: 4 mmol/L (ref 3.5–5.1)
Sodium: 144 mmol/L (ref 135–145)

## 2019-07-23 LAB — TROPONIN I (HIGH SENSITIVITY)
Troponin I (High Sensitivity): 16 ng/L (ref <18)
Troponin I (High Sensitivity): 17 ng/L (ref <18)

## 2019-07-23 MED ORDER — SODIUM CHLORIDE 0.9% FLUSH: 3.0000 mL | Freq: Once | INTRAVENOUS | Status: DC

## 2019-07-23 MED ORDER — PANTOPRAZOLE SODIUM 20 MG PO TBEC
20.0000 mg | DELAYED_RELEASE_TABLET | Freq: Every day | ORAL | 0 refills | Status: DC
Start: 2019-07-23 — End: 2021-11-28

## 2019-07-23 MED ORDER — ASPIRIN 81 MG PO CHEW
324.0000 mg | CHEWABLE_TABLET | Freq: Once | ORAL | Status: AC
  Administered 2019-07-23: 324 mg via ORAL
  Filled 2019-07-23: qty 4
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
## 2019-07-23 NOTE — Discharge Instructions (Addendum)
The exact cause of your chest pain is unclear.  All of your heart tests have been normal.  Your x-ray was normal, your blood pressure is improving.  You will need to follow-up very closely with your doctor and with a cardiologist, I have given you the phone number for the cardiologist above.  Please take a baby aspirin daily in addition to your blood pressure medication and a new acid reflux medication which I have sent to your pharmacy.  If you continue to have chest pain or if it becomes worsened or anything is becoming more concerning please come back to the emergency department immediately.  Otherwise follow-up with a cardiologist in the next 2 or 3 days

## 2019-07-23 NOTE — ED Notes (Signed)
Walked patient to the bathroom patient did well 

## 2019-07-23 NOTE — ED Triage Notes (Signed)
Pt presents to ED POV. Pt c/o central CP that began yesterday. C/o n, dizziness, weakness, diaphoresis, all yesterday. NAD currently

## 2019-07-28 ENCOUNTER — Other Ambulatory Visit: Payer: Self-pay

## 2019-07-28 ENCOUNTER — Encounter (HOSPITAL_COMMUNITY): Payer: Self-pay

## 2019-07-28 ENCOUNTER — Ambulatory Visit (HOSPITAL_COMMUNITY)
Admission: EM | Admit: 2019-07-28 | Discharge: 2019-07-28 | Disposition: A | Discharge status: Home / Self Care | Payer: 59 | Attending: Urgent Care | Admitting: Urgent Care

## 2019-07-28 DIAGNOSIS — M549 Dorsalgia, unspecified: Secondary | ICD-10-CM | POA: Diagnosis not present

## 2019-07-28 DIAGNOSIS — M546 Pain in thoracic spine: Secondary | ICD-10-CM

## 2019-07-28 DIAGNOSIS — M62830 Muscle spasm of back: Secondary | ICD-10-CM

## 2019-07-28 HISTORY — DX: Essential (primary) hypertension: I10

## 2019-07-28 LAB — POCT URINALYSIS DIP (DEVICE)
Bilirubin Urine: NEGATIVE
Glucose, UA: NEGATIVE mg/dL
Hgb urine dipstick: NEGATIVE
Ketones, ur: NEGATIVE mg/dL
Leukocytes,Ua: NEGATIVE
Nitrite: NEGATIVE
Protein, ur: NEGATIVE mg/dL
Specific Gravity, Urine: >=1.03 (ref 1.005–1.030)
Urobilinogen, UA: 0.2 mg/dL (ref 0.0–1.0)
pH: 5.5 (ref 5.0–8.0)

## 2019-07-28 MED ORDER — MELOXICAM 7.5 MG PO TABS: 7.5000 mg | ORAL_TABLET | Freq: Every day | ORAL | 0 refills | Status: DC

## 2019-07-28 MED ORDER — TIZANIDINE HCL 4 MG PO TABS: 4.0000 mg | ORAL_TABLET | Freq: Three times a day (TID) | ORAL | 0 refills | Status: DC | PRN
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
## 2019-07-28 NOTE — ED Triage Notes (Signed)
Pt c/o 8/10 pain in right mid backx2-3 wks. Pt states his back hurts when he inhales and it feels "like his back wants to give out when he walks." Pt denies injury. Pt denies urinary issue. Pt was able to walk to exam room. Pt denies numbness and tingling.

## 2019-07-28 NOTE — Discharge Instructions (Signed)
Please make sure that you are hydrating very well with at least 64 ounces of water daily.  If you have a particularly strenuous work day, work outdoors then make sure you increase this to 96 ounces of water.  Otherwise you can take meloxicam for pain and inflammation.  Use tizanidine for muscle relaxant properties.  This medication can make you sleepy and therefore if it does this he can take it at bedtime.

## 2019-07-30 ENCOUNTER — Ambulatory Visit: Payer: 59 | Admitting: Student in an Organized Health Care Education/Training Program

## 2019-09-07 ENCOUNTER — Ambulatory Visit: Payer: 59 | Admitting: Student in an Organized Health Care Education/Training Program

## 2019-09-08 ENCOUNTER — Ambulatory Visit (INDEPENDENT_AMBULATORY_CARE_PROVIDER_SITE_OTHER): Payer: 59 | Admitting: Family Medicine

## 2019-09-08 ENCOUNTER — Encounter: Payer: Self-pay | Admitting: Family Medicine

## 2019-09-08 ENCOUNTER — Other Ambulatory Visit: Payer: Self-pay

## 2019-09-08 ENCOUNTER — Ambulatory Visit: Payer: 59 | Admitting: Student in an Organized Health Care Education/Training Program

## 2019-09-08 VITALS — BP 150/78 | HR 80 | Ht 71.0 in | Wt 194.0 lb

## 2019-09-08 DIAGNOSIS — I10 Essential (primary) hypertension: Secondary | ICD-10-CM | POA: Diagnosis not present

## 2019-09-08 DIAGNOSIS — M545 Low back pain, unspecified: Secondary | ICD-10-CM | POA: Insufficient documentation

## 2019-09-08 DIAGNOSIS — R062 Wheezing: Secondary | ICD-10-CM

## 2019-09-08 MED ORDER — ALBUTEROL SULFATE HFA 108 (90 BASE) MCG/ACT IN AERS
2.0000 | INHALATION_SPRAY | Freq: Four times a day (QID) | RESPIRATORY_TRACT | 0 refills | Status: DC | PRN
Start: 2019-09-08 — End: 2021-11-28

## 2019-09-08 MED ORDER — HYDROCHLOROTHIAZIDE 25 MG PO TABS: 25.0000 mg | ORAL_TABLET | Freq: Every day | ORAL | 1 refills | Status: DC

## 2019-09-08 NOTE — Progress Notes (Signed)
    SUBJECTIVE:   CHIEF COMPLAINT / HPI:   HTN: Out of his medications for a few days.He was previously on Norvasc 5 mg qd but this was switched to HCTZ 25 mg qd which he tolerated well. He had not checked his BP in a while since he has been out of his meds for > 1 week. He denies any headache, no change in vision or GI symptoms at the moment.  Back/Shoulder pain:C/O chronic low back and left shoulder pain for which he uses Morbic and Zanaflex as needed. He denies any direct injury or recent fall. He works at [employer] place, lifting heavy items. His pain is well controlled on his current regimen.  Hx of Wheezing: He had a recent ED visit for respiratory symptoms and was found to be wheezy. He was d/c home on Albuterol which he stated worked well for him. Since out of this medication, he had had wheezy episodes intermittently and will need a refill of his albuterol. He does smoke.  PERTINENT  PMH / PSH: Reviewed PMX.  OBJECTIVE:   Vitals:   09/08/19 0943  BP: (!) 150/78  Pulse: 80  SpO2: 98%  Weight: 194 lb (88 kg)  Height: 5\' 11"  (1.803 m)    Physical Exam Vitals and nursing note reviewed.  Cardiovascular:     Rate and Rhythm: Normal rate and regular rhythm.     Pulses: Normal pulses.     Heart sounds: Normal heart sounds. No murmur heard.   Pulmonary:     Effort: Pulmonary effort is normal. No respiratory distress.     Breath sounds: Normal breath sounds. No wheezing or rhonchi.  Abdominal:     General: Bowel sounds are normal.     Palpations: Abdomen is soft. There is no mass.     Tenderness: There is no abdominal tenderness.  Musculoskeletal:     Right shoulder: Normal range of motion.     Left shoulder: Normal range of motion.     Lumbar back: Normal range of motion.     Right lower leg: No edema.     Left lower leg: No edema.      ASSESSMENT/PLAN:   Essential hypertension BP elevated today. Off meds for more than 1 week. I refilled his HCTZ 25 mg qd. Home  BP monitoring instruction provided. Return in 2 weeks for reassessment. He agreed with the plan.  Lumbar pain Lumbar and left shoulder pain. No neurologic deficit on exam and his gait was normal. I reviewed his radiology record, no recent imaging. F/U with PCP soon for reassessment for imaging or PT eval. Continue Mobic and Zanaflex as recommended.  Wheezing Record reviewed. ED visit in April for respiratory symptoms for which he was wheezing, hence was sent home on albuterol. I refilled his albuterol. Given his hx of smoking, he will benefit from PFT. PCP to f/u on this at his next visit.  F/U as needed.     May, MD [facility]

## 2019-09-08 NOTE — Patient Instructions (Signed)
Blood Pressure Record Sheet To take your blood pressure, you will need a blood pressure machine. You can buy a blood pressure machine (blood pressure monitor) at your clinic, drug store, or online. When choosing one, consider:  An automatic monitor that has an arm cuff.  A cuff that wraps snugly around your upper arm. You should be able to fit only one finger between your arm and the cuff.  A device that stores blood pressure reading results.  Do not choose a monitor that measures your blood pressure from your wrist or finger. Follow your health care provider's instructions for how to take your blood pressure. To use this form:  Get one reading in the morning (a.m.) before you take any medicines.  Get one reading in the evening (p.m.) before supper.  Take at least 2 readings with each blood pressure check. This makes sure the results are correct. Wait 1-2 minutes between measurements.  Write down the results in the spaces on this form.  Repeat this once a week, or as told by your health care provider.  Make a follow-up appointment with your health care provider to discuss the results. Blood pressure log Date: _______________________  a.m. _____________________(1st reading) _____________________(2nd reading)  p.m. _____________________(1st reading) _____________________(2nd reading) Date: _______________________  a.m. _____________________(1st reading) _____________________(2nd reading)  p.m. _____________________(1st reading) _____________________(2nd reading) Date: _______________________  a.m. _____________________(1st reading) _____________________(2nd reading)  p.m. _____________________(1st reading) _____________________(2nd reading) Date: _______________________  a.m. _____________________(1st reading) _____________________(2nd reading)  p.m. _____________________(1st reading) _____________________(2nd reading) Date: _______________________  a.m.  _____________________(1st reading) _____________________(2nd reading)  p.m. _____________________(1st reading) _____________________(2nd reading) This information is not intended to replace advice given to you by your health care provider. Make sure you discuss any questions you have with your health care provider. Document Revised: 03/29/2017 Document Reviewed: 01/29/2017 Elsevier Patient Education  2020 [employer]  

## 2019-09-08 NOTE — Assessment & Plan Note (Signed)
Record reviewed. ED visit in April for respiratory symptoms for which he was wheezing, hence was sent home on albuterol. I refilled his albuterol. Given his hx of smoking, he will benefit from PFT. PCP to f/u on this at his next visit.  F/U as needed.

## 2019-09-08 NOTE — Assessment & Plan Note (Signed)
BP elevated today. Off meds for more than 1 week. I refilled his HCTZ 25 mg qd. Home BP monitoring instruction provided. Return in 2 weeks for reassessment. He agreed with the plan.

## 2019-09-08 NOTE — Assessment & Plan Note (Signed)
Lumbar and left shoulder pain. No neurologic deficit on exam and his gait was normal. I reviewed his radiology record, no recent imaging. F/U with PCP soon for reassessment for imaging or PT eval. Continue Mobic and Zanaflex as recommended.

## 2019-09-23 ENCOUNTER — Ambulatory Visit: Payer: 59 | Admitting: Student in an Organized Health Care Education/Training Program

## 2019-10-02 ENCOUNTER — Other Ambulatory Visit: Payer: Self-pay | Admitting: Family Medicine

## 2020-05-02 ENCOUNTER — Other Ambulatory Visit: Payer: Self-pay | Admitting: Family Medicine

## 2020-05-03 NOTE — Telephone Encounter (Signed)
Left message with patient's wife for patient to call back to schedule an appointment for refills.  Glennie Hawk, CMA

## 2021-04-12 IMAGING — MR MR HEAD W/O CM
6 of 10 series · 27 of 48 positions shown · IV contrast (gadavist)
Comparison: Head CT 06/15/2019

CLINICAL DATA: Acute headache

EXAM:
MRI HEAD WITHOUT CONTRAST
MRV HEAD WITHOUT AND WITH CONTRAST
TECHNIQUE: Multiplanar, multiecho pulse sequences of the brain and surrounding
structures were obtained without intravenous contrast. Angiographic
images of the intracranial venous structures were obtained using MRV
technique without and with intravenous contrast.
CONTRAST:  10 mL Gadavist

[Series 2: DWI · axial · 3.0mm · 0.94mm/px · z∈[-90,+59]mm · 8 of 104 slices shown (1 of 2)]
[im 1/104]
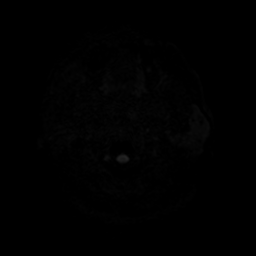
[im 12/104]
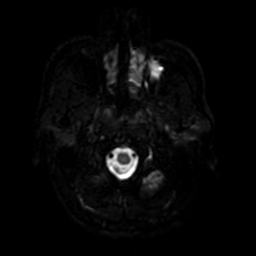
[im 35/104]
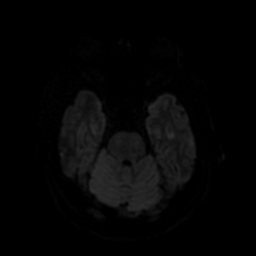
[im 46/104]
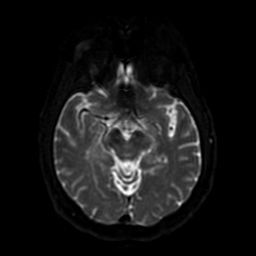
[im 58/104]
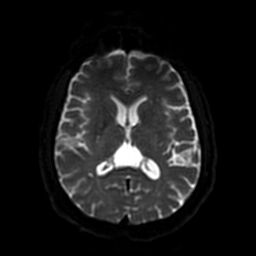
[im 69/104]
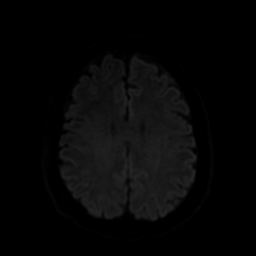
[im 92/104]
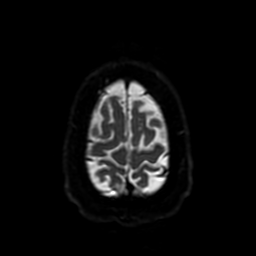
[im 104/104]
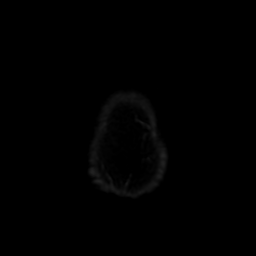

[Series 3: DWI · coronal · 4.0mm · 0.94mm/px · 7 of 72 slices shown (2 of 2)]
[im 1/72]
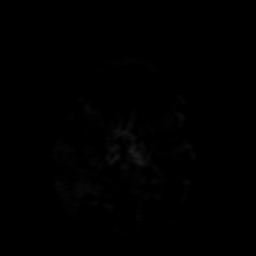
[im 12/72]
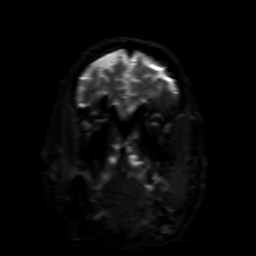
[im 24/72]
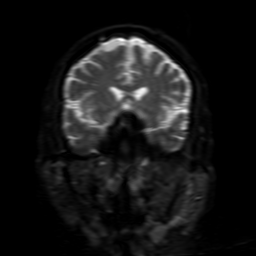
[im 36/72]
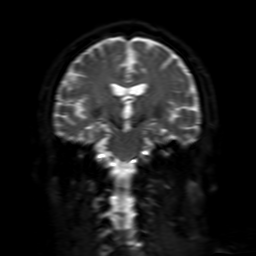
[im 48/72]
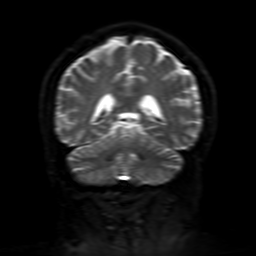
[im 60/72]
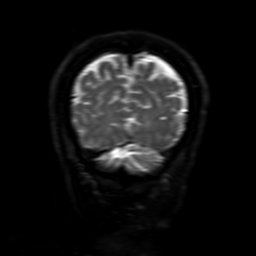
[im 72/72]
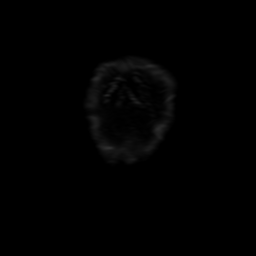

[Series 4: FLAIR · sagittal · 5.0mm · 0.23mm/px · 2 of 25 slices shown (1 of 2)]
[im 1/25]
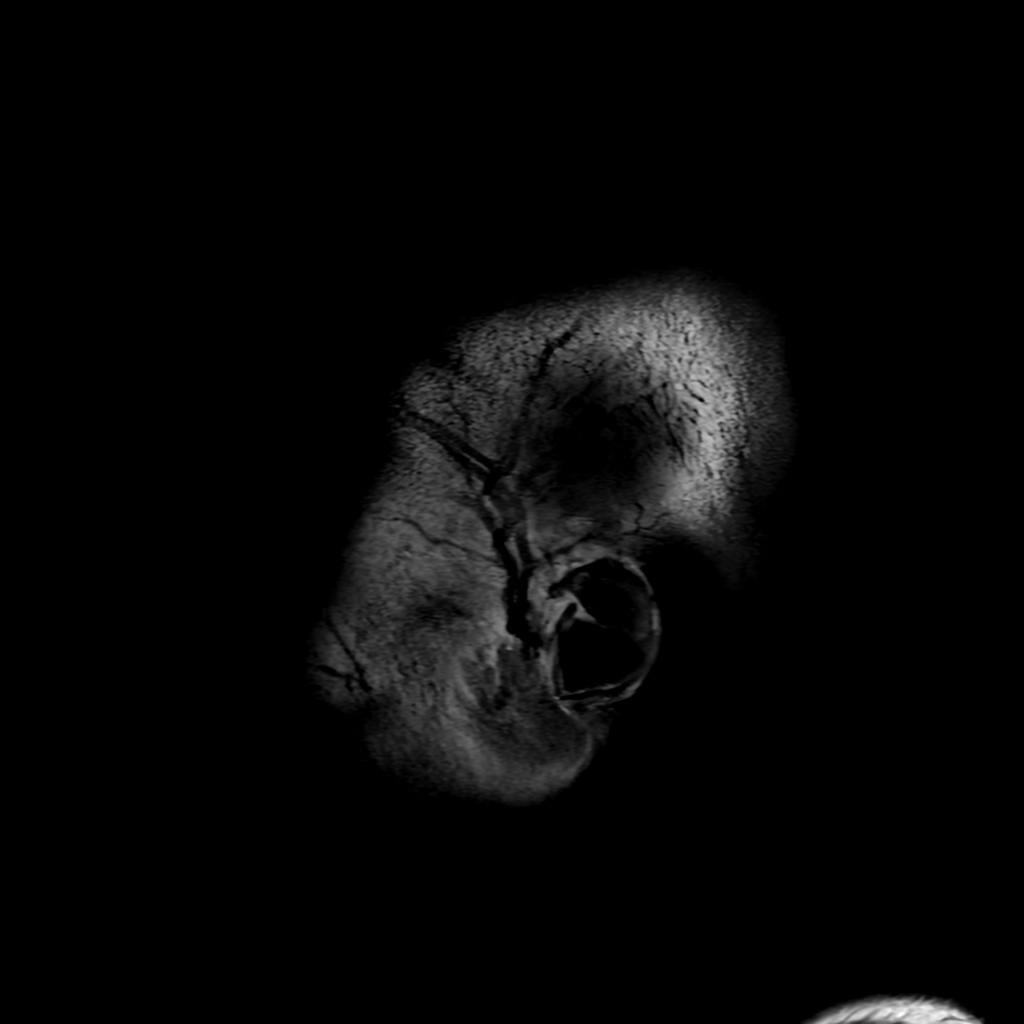
[im 25/25]
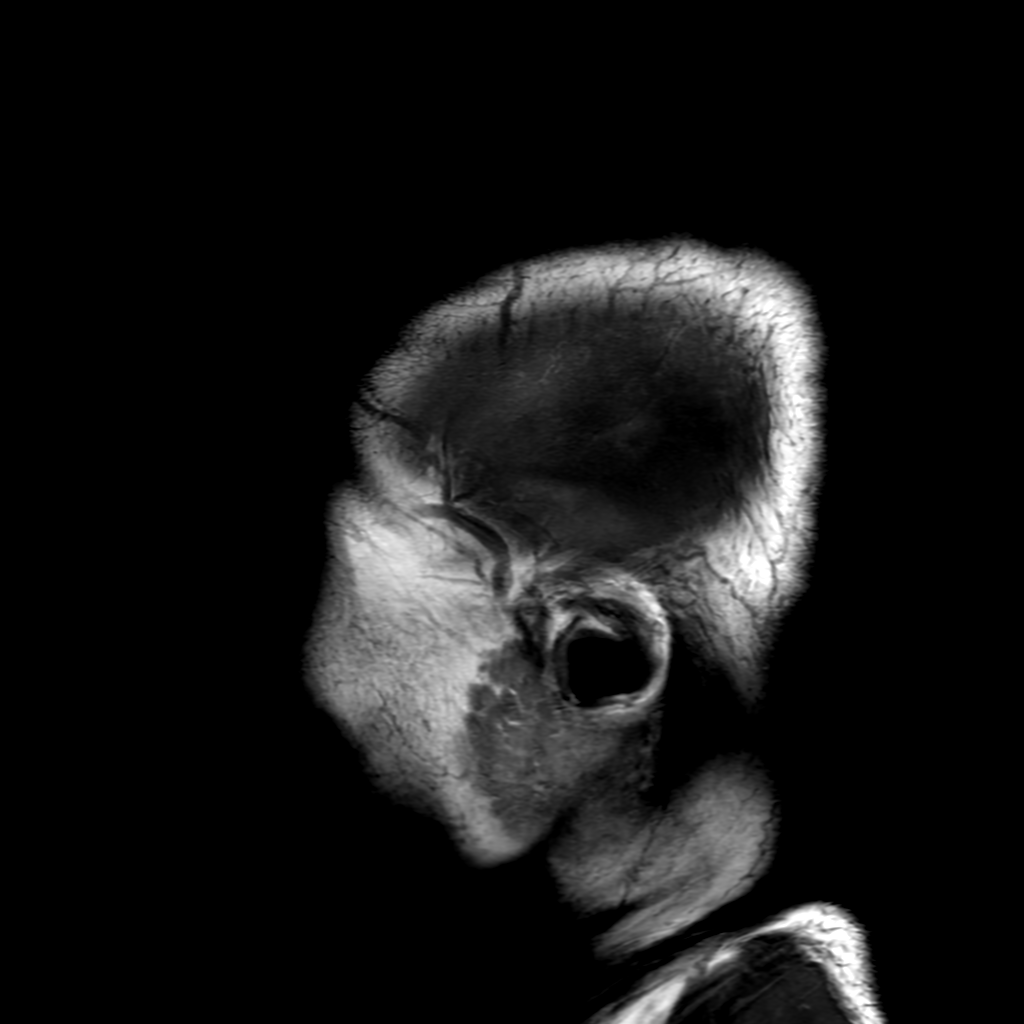

[Series 7: FLAIR · axial · 3.0mm · 0.41mm/px · z∈[-78,+69]mm · 2 of 26 slices shown (2 of 2)]
[im 1/26]
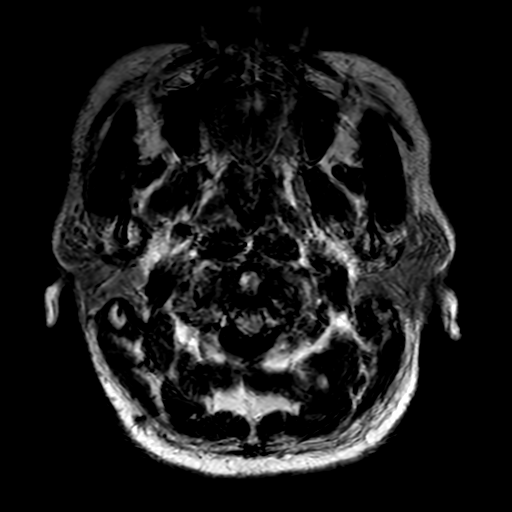
[im 26/26]
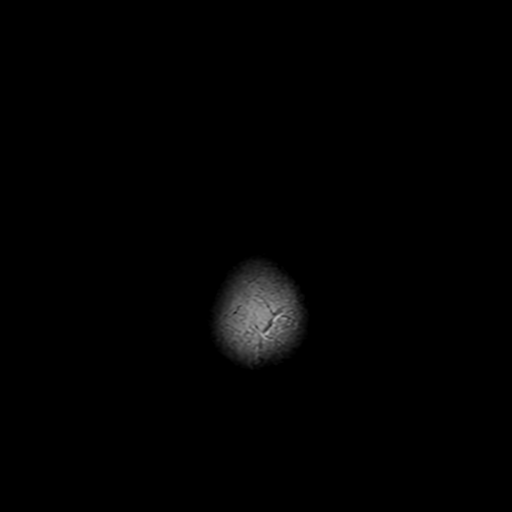

[Series 250: ADC · axial · 3.0mm · 0.94mm/px · z∈[-90,+59]mm · 5 of 51 slices shown (1 of 2)]
[im 1/51]
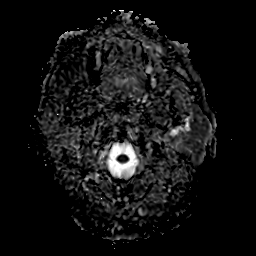
[im 13/51]
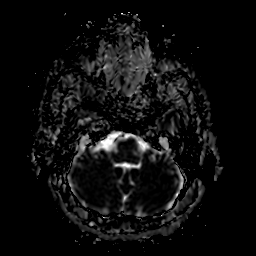
[im 26/51]
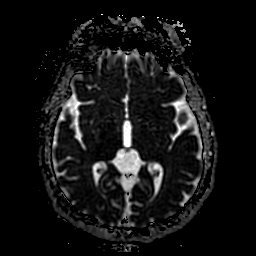
[im 38/51]
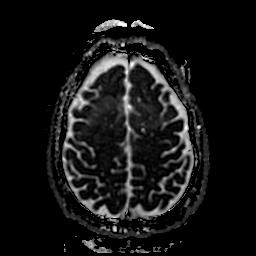
[im 51/51]
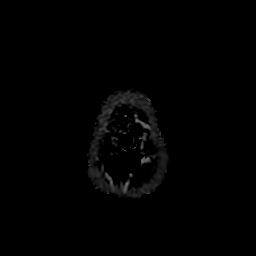

[Series 350: ADC · coronal · 4.0mm · 0.94mm/px · 3 of 36 slices shown (2 of 2)]
[im 1/36]
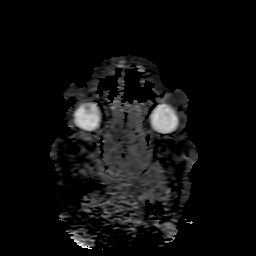
[im 18/36]
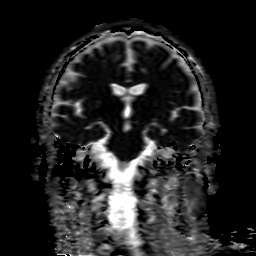
[im 36/36]
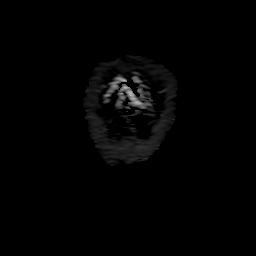

[27 of 48 positions shown; findings below may reference images not displayed]

FINDINGS: MRI BRAIN FINDINGS

BRAIN: No acute infarct, acute hemorrhage or extra-axial collection.
Normal white matter signal. Normal volume of CSF spaces. No chronic
microhemorrhage. Cavum velum interpositum cyst incidentally noted.

VASCULAR: Major flow voids are preserved.

SKULL AND UPPER CERVICAL SPINE: Normal calvarium and skull base.
Visualized upper cervical spine and soft tissues are normal.

SINUSES/ORBITS: No paranasal sinus fluid levels or advanced mucosal
thickening. No mastoid or middle ear effusion. Normal orbits.

MRV BRAIN FINDINGS

Superior sagittal sinus: Normal.

Straight sinus: Normal.

Inferior sagittal sinus, vein of Shen and internal cerebral veins:
Normal.

Transverse sinuses: Normal.

Sigmoid sinuses: Normal.

Visualized jugular veins: Normal.
IMPRESSION: 1. No acute intracranial abnormality.
2. Normal MRV of the brain.

## 2021-04-12 IMAGING — MR MR MRV HEAD WO/W CM
4 of 6 series · 19 of 48 positions shown · IV contrast (10ML GAD)
Comparison: Head CT 06/15/2019

CLINICAL DATA: Acute headache

EXAM:
MRI HEAD WITHOUT CONTRAST
MRV HEAD WITHOUT AND WITH CONTRAST
TECHNIQUE: Multiplanar, multiecho pulse sequences of the brain and surrounding
structures were obtained without intravenous contrast. Angiographic
images of the intracranial venous structures were obtained using MRV
technique without and with intravenous contrast.
CONTRAST:  10 mL Gadavist

[Series 6: MRV · coronal · 1.5mm · 0.43mm/px · 5 of 129 slices shown]
[im 1/129]
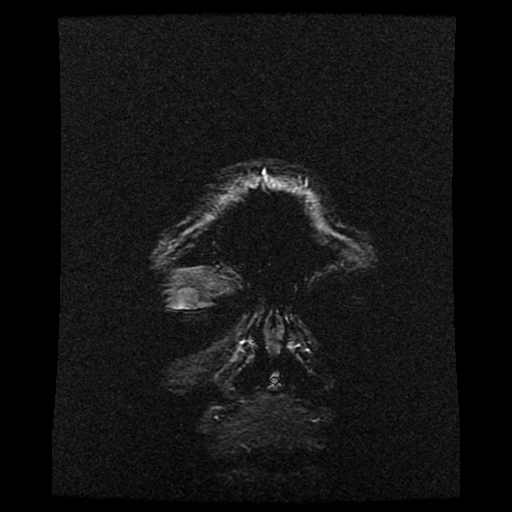
[im 33/129]
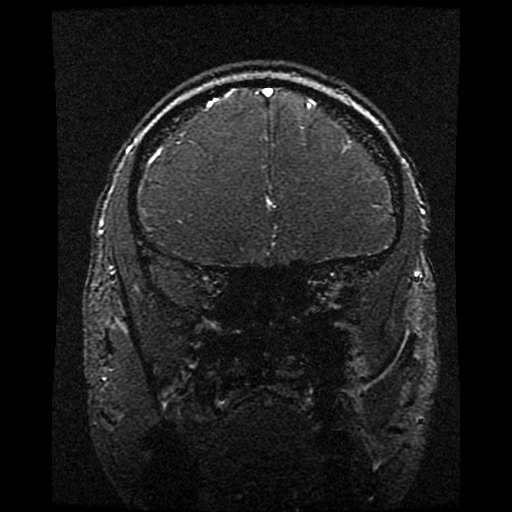
[im 65/129]
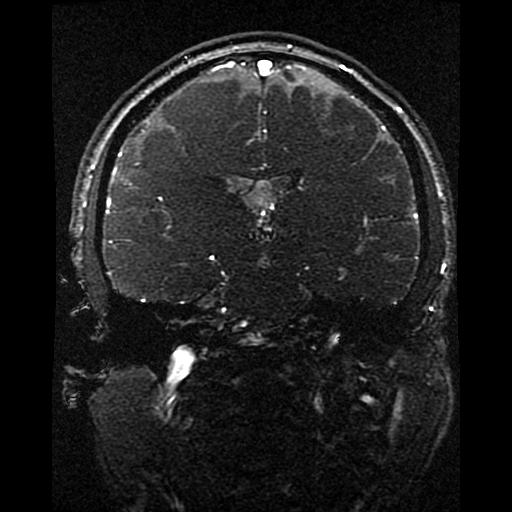
[im 97/129]
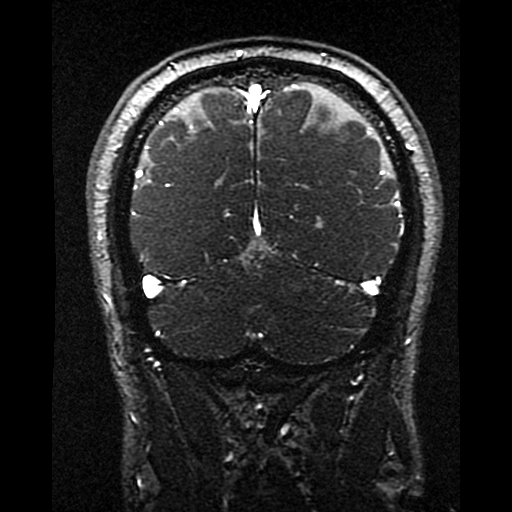
[im 129/129]
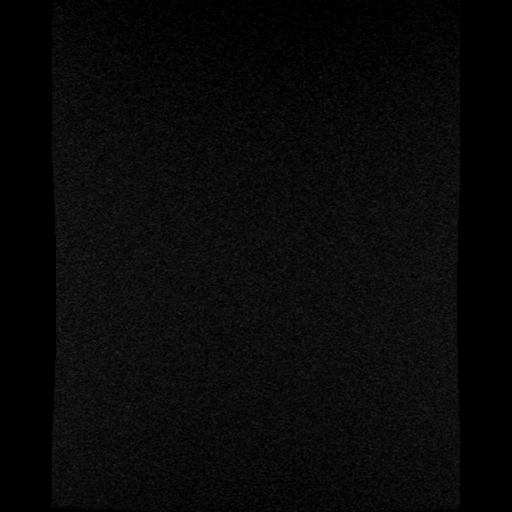

[Series 11: sag inhance (id) · sagittal · 1.8mm · 0.47mm/px · 8 of 360 slices shown]
[im 1/360]
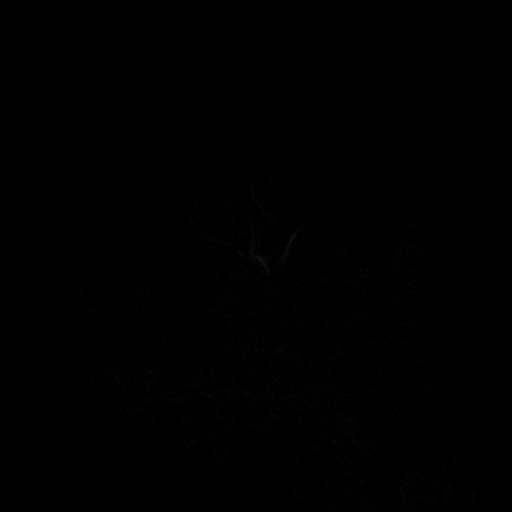
[im 52/360]
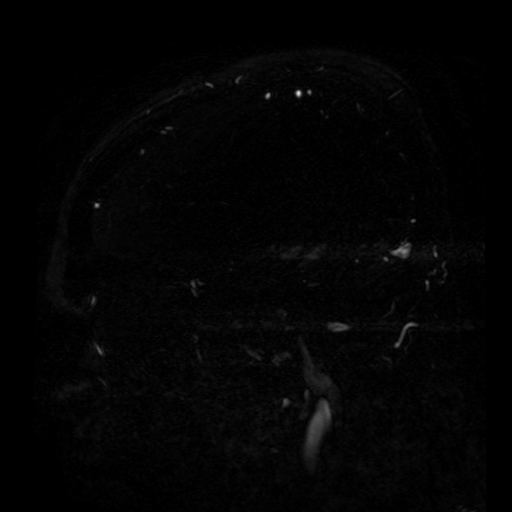
[im 103/360]
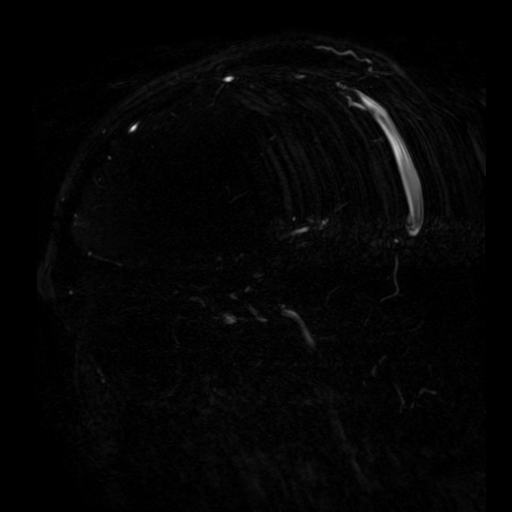
[im 154/360]
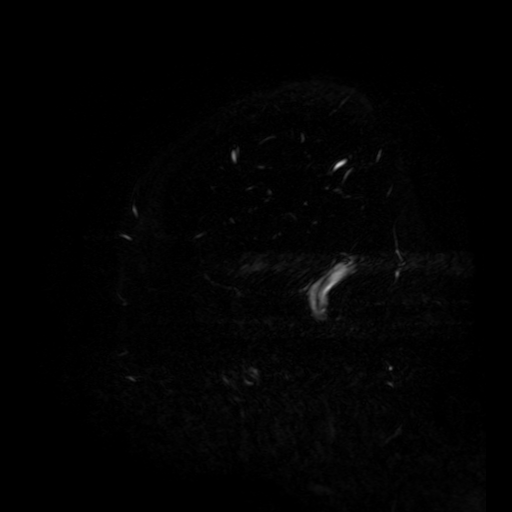
[im 180/360]
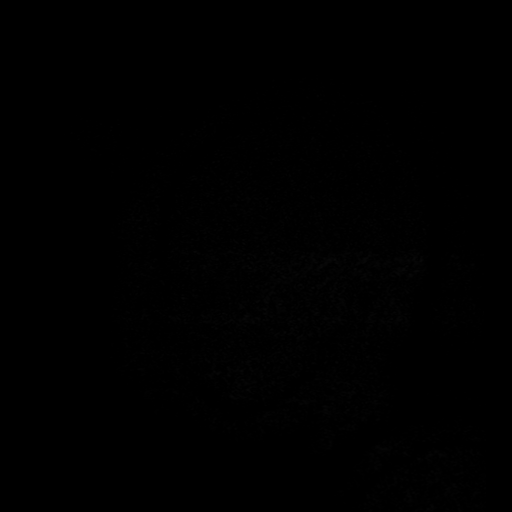
[im 206/360]
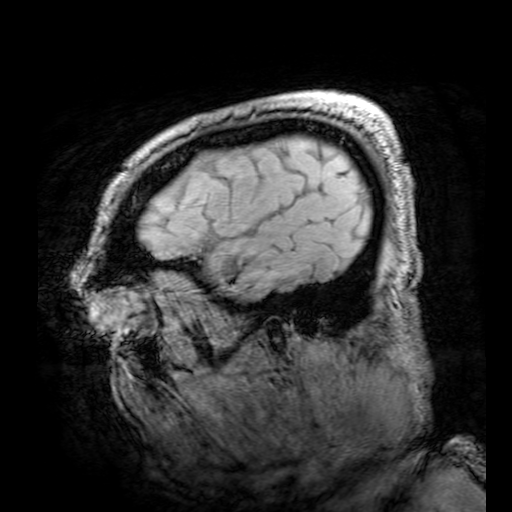
[im 257/360]
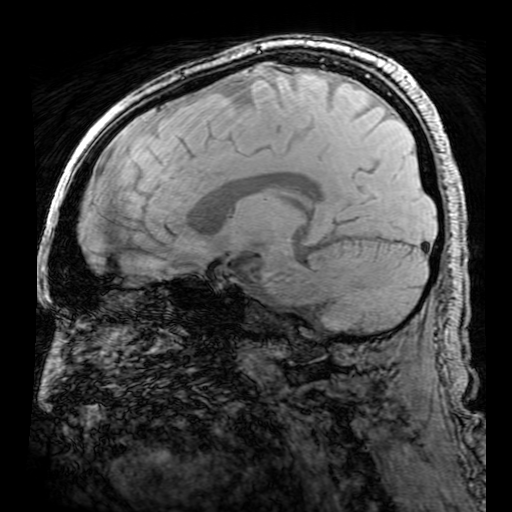
[im 308/360]
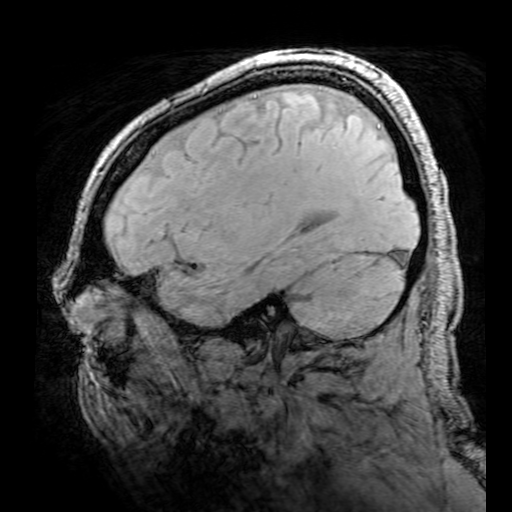

[Series 1200: multiplanar reconstruction (mpr) · sagittal · 0.9mm · 0.47mm/px · 3 of 206 slices shown (1 of 2)]
[im 26/206]
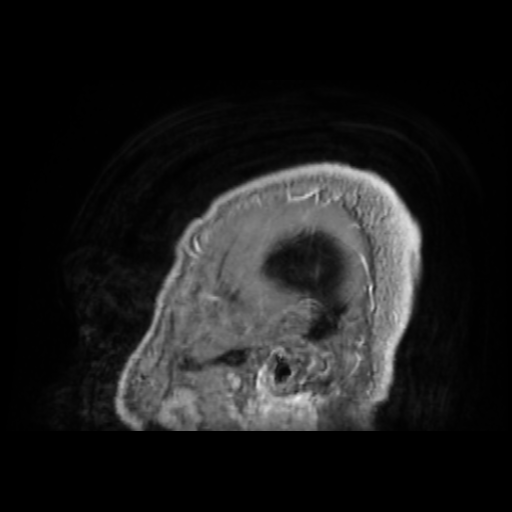
[im 103/206]
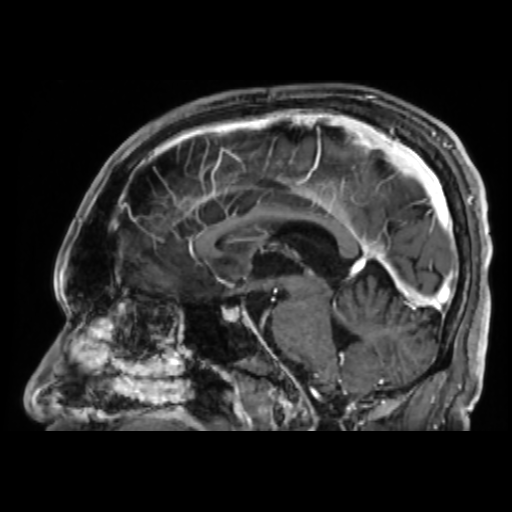
[im 180/206]
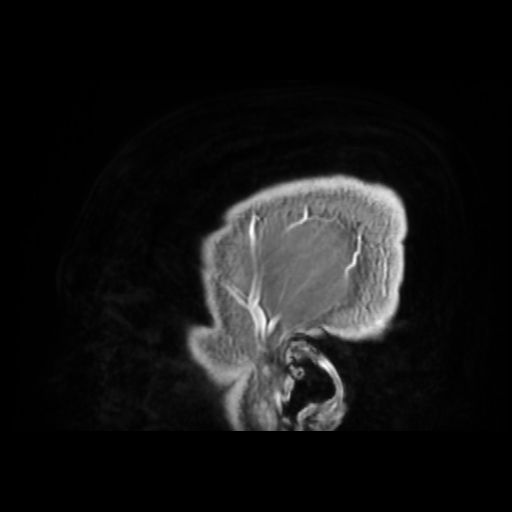

[Series 1201: multiplanar reconstruction (mpr) · coronal · 0.9mm · 0.47mm/px · 3 of 254 slices shown (2 of 2)]
[im 26/254]
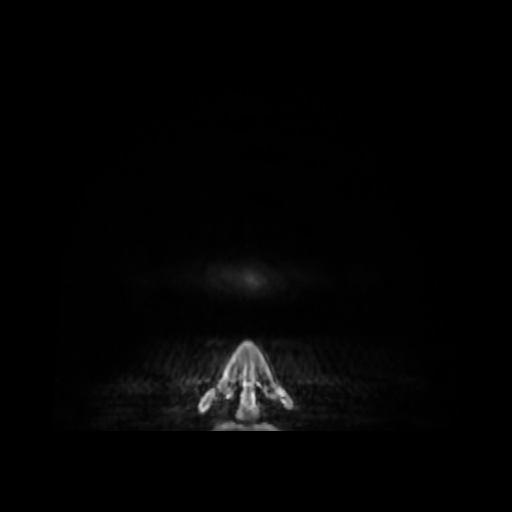
[im 127/254]
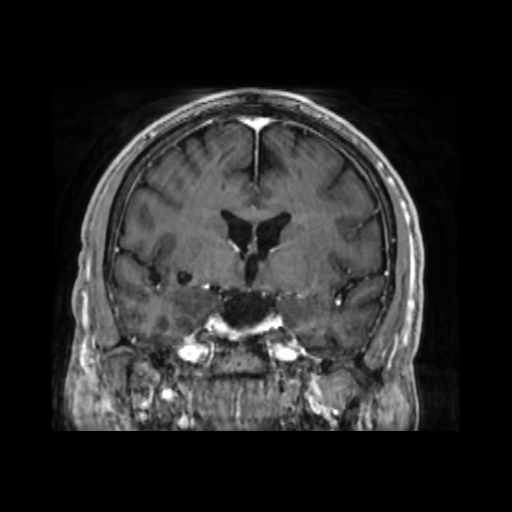
[im 228/254]
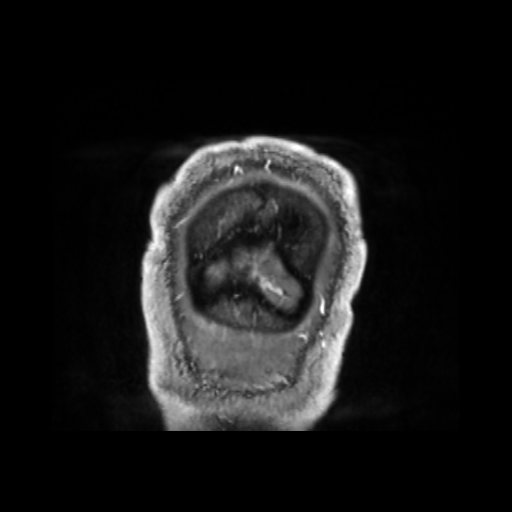

[19 of 48 positions shown; findings below may reference images not displayed]

FINDINGS: MRI BRAIN FINDINGS

BRAIN: No acute infarct, acute hemorrhage or extra-axial collection.
Normal white matter signal. Normal volume of CSF spaces. No chronic
microhemorrhage. Cavum velum interpositum cyst incidentally noted.

VASCULAR: Major flow voids are preserved.

SKULL AND UPPER CERVICAL SPINE: Normal calvarium and skull base.
Visualized upper cervical spine and soft tissues are normal.

SINUSES/ORBITS: No paranasal sinus fluid levels or advanced mucosal
thickening. No mastoid or middle ear effusion. Normal orbits.

MRV BRAIN FINDINGS

Superior sagittal sinus: Normal.

Straight sinus: Normal.

Inferior sagittal sinus, vein of Shen and internal cerebral veins:
Normal.

Transverse sinuses: Normal.

Sigmoid sinuses: Normal.

Visualized jugular veins: Normal.
IMPRESSION: 1. No acute intracranial abnormality.
2. Normal MRV of the brain.

## 2021-11-13 NOTE — Progress Notes (Deleted)
    SUBJECTIVE:   CHIEF COMPLAINT / HPI:   ***  PERTINENT  PMH / PSH:   Hypertension   OBJECTIVE:   There were no vitals taken for this visit.  ***  ASSESSMENT/PLAN:   No problem-specific Assessment & Plan notes found for this encounter.     Lind Covert, MD [place]

## 2021-11-14 ENCOUNTER — Ambulatory Visit: Payer: Self-pay | Admitting: Family Medicine

## 2021-11-27 NOTE — Patient Instructions (Incomplete)
Good to see you today - Thank you for coming in  Things we discussed today:  You need a colonoscopy to prevent colon cancer.  I have placed a referral to the gastroenterologist's office.  They should call you within two weeks.  If they do not please let me know   I sent a prescription to your pharmacy for your Zoster vaccines to help prevent Shingles.  The shot may cause a sore arm and mild flu like symptoms for a few days.  You will need a second shot 2 months after your first.    Please always bring your medication bottles

## 2021-11-27 NOTE — Progress Notes (Unsigned)
    SUBJECTIVE:   CHIEF COMPLAINT / HPI:   Just released from prison in August 2023.  Stopped taking all his medications about a month ago since felt they might be causing impotence.   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  He thinks he had a heart attack in prison.  No recent chest pain or severe shortness of breath.  Has nitro but has not used  Hypertension Has 3 medication bottles with him but has not been taking - hctz, lisinopril and metoprolol.  No shortness of breath or chest pain or vision changes or edema or headache   Erections Cant achieve erections with his wife.   This is relatively new  Wheezing  Smokes about a ppd.  Has had wheezing for years.  Does not know of any formal diagnosis of COPD   Stress Feels very stressed with not finding work, car trouble, impotence and health concerns.  Never treated with antidepressant.  No alcohol or substance use   [phone] his phone   PERTINENT  PMH / PSH:      OBJECTIVE:   BP (!) 174/111   Pulse 78   Wt 241 lb 3.2 oz (109.4 kg)   SpO2 98%   BMI 33.64 kg/m   No acute distress Heart - Regular rate and rhythm.  No murmurs, gallops or rubs.    Lungs:  Normal respiratory effort, chest expands symmetrically. Lungs are clear to auscultation, no crackles or wheezes. Extremities:  No cyanosis, edema, or deformity noted with good range of motion of all major joints.    ASSESSMENT/PLAN:   Essential hypertension Not controlled due to nonadeherence.  Asked to restart hctz and metoprolol given his CAD.  Likely will need additional medication would switch his prior ACE to and ARB if needed.  Check labs   Wheezing None currently on exam.  Will discuss smoking cessation later  CAD (coronary artery disease) Need to send for records.  Seems stable even off all his medications.  Restart plavix and asa and atorvastatin and metoprolol   Mood disturbance Unsure of exact diagnosis.  No history of mania.  Will start low dose SSRI  and follow up soon.  Will offer counseling then.   Patient Instructions  Good to see you today - Thank you for coming in  Things we discussed today:  Blood pressure Take the two blood pressure medications - HCTZ and the metoprolol  Heart Take clopidrogrel and the aspirin  Cholesterol Take the atorvastatin  Stress Sertraline 50 mg once a day  I will call you if your tests are not good.  Otherwise, I will send you a message on MyChart (if it is active) or a letter in the mail..  If you do not hear from me with in 2 weeks please call our office.     Please always bring your medication bottles  Come back in 1-2 weeks    Lind Covert, MD [place]

## 2021-11-28 ENCOUNTER — Encounter: Payer: Self-pay | Admitting: Family Medicine

## 2021-11-28 ENCOUNTER — Ambulatory Visit (INDEPENDENT_AMBULATORY_CARE_PROVIDER_SITE_OTHER): Payer: Commercial Managed Care - HMO | Admitting: Family Medicine

## 2021-11-28 ENCOUNTER — Other Ambulatory Visit: Payer: Self-pay

## 2021-11-28 VITALS — BP 174/111 | HR 78 | Wt 241.2 lb

## 2021-11-28 DIAGNOSIS — R4586 Emotional lability: Secondary | ICD-10-CM | POA: Insufficient documentation

## 2021-11-28 DIAGNOSIS — I2510 Atherosclerotic heart disease of native coronary artery without angina pectoris: Secondary | ICD-10-CM | POA: Insufficient documentation

## 2021-11-28 DIAGNOSIS — I10 Essential (primary) hypertension: Secondary | ICD-10-CM

## 2021-11-28 DIAGNOSIS — R062 Wheezing: Secondary | ICD-10-CM

## 2021-11-28 DIAGNOSIS — Z114 Encounter for screening for human immunodeficiency virus [HIV]: Secondary | ICD-10-CM

## 2021-11-28 DIAGNOSIS — Z1159 Encounter for screening for other viral diseases: Secondary | ICD-10-CM | POA: Diagnosis not present

## 2021-11-28 DIAGNOSIS — Z125 Encounter for screening for malignant neoplasm of prostate: Secondary | ICD-10-CM | POA: Diagnosis not present

## 2021-11-28 MED ORDER — SERTRALINE HCL 50 MG PO TABS: 50.0000 mg | ORAL_TABLET | Freq: Every day | ORAL | 3 refills | Status: DC

## 2021-11-28 NOTE — Assessment & Plan Note (Addendum)
Not controlled due to nonadeherence. No sign of emergency hypertension.  Asked to restart hctz and metoprolol given his CAD.  Likely will need additional medication would switch his prior ACE to and ARB if needed.  Check labs

## 2021-11-28 NOTE — Assessment & Plan Note (Signed)
Need to send for records.  Seems stable even off all his medications.  Restart plavix and asa and atorvastatin and metoprolol

## 2021-11-28 NOTE — Assessment & Plan Note (Signed)
Unsure of exact diagnosis.  No history of mania.  Will start low dose SSRI and follow up soon.  Will offer counseling then.

## 2021-11-28 NOTE — Assessment & Plan Note (Signed)
None currently on exam.  Will discuss smoking cessation later

## 2021-11-29 LAB — CMP14+EGFR
ALT: 13 IU/L (ref 0–44)
AST: 17 IU/L (ref 0–40)
Albumin/Globulin Ratio: 1.9 (ref 1.2–2.2)
Albumin: 4.7 g/dL (ref 3.8–4.9)
Alkaline Phosphatase: 92 IU/L (ref 44–121)
BUN/Creatinine Ratio: 11 (ref 9–20)
BUN: 14 mg/dL (ref 6–24)
Bilirubin Total: 0.3 mg/dL (ref 0.0–1.2)
CO2: 20 mmol/L (ref 20–29)
Calcium: 9.7 mg/dL (ref 8.7–10.2)
Chloride: 101 mmol/L (ref 96–106)
Creatinine, Ser: 1.23 mg/dL (ref 0.76–1.27)
Globulin, Total: 2.5 g/dL (ref 1.5–4.5)
Glucose: 82 mg/dL (ref 70–99)
Potassium: 4.6 mmol/L (ref 3.5–5.2)
Sodium: 139 mmol/L (ref 134–144)
Total Protein: 7.2 g/dL (ref 6.0–8.5)
eGFR: 68 mL/min/{1.73_m2} (ref >59)

## 2021-11-29 LAB — PSA: Prostate Specific Ag, Serum: 0.3 ng/mL (ref 0.0–4.0)

## 2021-11-29 LAB — HIV ANTIBODY (ROUTINE TESTING W REFLEX): HIV Screen 4th Generation wRfx: NONREACTIVE

## 2021-11-29 LAB — HEPATITIS C ANTIBODY: Hep C Virus Ab: NONREACTIVE

## 2021-12-04 NOTE — Progress Notes (Signed)
    SUBJECTIVE:   CHIEF COMPLAINT / HPI:   Essential hypertension Taking blood pressure medications by report but did not bring in.  No lightheadness or chest pain    Wheezing Notices when goes up steps or walks.  Walking about a mile a day for exercise.  No severe shortness of breath or chest pain.  No edema   CAD (coronary artery disease) Taking plavix and asa and atorvastatin and metoprolol.     Mood disturbance Still under stress and not sleeping well.  No suicidal ideation.  Just got the sertraline took one dose.  No problems   ED Since was out of jail in August.  Feels is due to stress.  Would like to try viagra.  Not taking nitroglycerin     OBJECTIVE:   BP (!) 146/80   Pulse 81   Ht 5\' 11"  (1.803 m)   Wt 239 lb 12.8 oz (108.8 kg)   SpO2 95%   BMI 33.45 kg/m   Heart - Regular rate and rhythm.  No murmurs, gallops or rubs.    Lungs:  Normal respiratory effort, chest expands symmetrically. Lungs are clear to auscultation, no crackles or wheezes. Extremities:  No cyanosis, edema, or deformity noted with good range of motion of all major joints.     ASSESSMENT/PLAN:   Erectile dysfunction, unspecified erectile dysfunction type Assessment & Plan: Will give trial of viagra.   See after visit summary cautions   Orders: -     Sildenafil Citrate; Take 0.5-1 tablets (50-100 mg total) by mouth daily as needed for erectile dysfunction.  Dispense: 10 tablet; Refill: 11  Wheezing Assessment & Plan: Very likely COPD.   Continue to work on smoking cessation.   Trial of albuterol which he used before.  Likely PFTs in the future   Orders: -     Albuterol Sulfate HFA; Inhale 2 puffs into the lungs every 6 (six) hours as needed for wheezing or shortness of breath.  Dispense: 8 g; Refill: 2  Coronary artery disease involving native heart without angina pectoris, unspecified vessel or lesion type Assessment & Plan: Stable continue current medications    Tobacco  abuse Assessment & Plan: Smoking 1 ppd.  Has for years.  Wants to stop but feels now not a good time with stress of no job or transportation   Mood disturbance Assessment & Plan: No change.  Not interested in therapist due time and transportation (lives in [place])    Essential hypertension Assessment & Plan: Much improved.  Continue current medications       Patient Instructions  Good to see you today - Thank you for coming in  Things we discussed today:  ED I sent in a prescription for viagra - let me know if your insurance needs a different medication Do NOT take any nitroglycerin with the viagra  Blood pressure Continue taking your medications as you are  Shortness of breath or chest pain If you have worsening shortness of breath or chest pain that the albuterol does not help then you need to come back immediately or the chest pain does not go away then call 911  Smoking Aim to cut back a few cigarettes a day  Please always bring your medication bottles  Come back to see me in one month    Lind Covert, MD [place]

## 2021-12-05 ENCOUNTER — Ambulatory Visit (INDEPENDENT_AMBULATORY_CARE_PROVIDER_SITE_OTHER): Payer: Commercial Managed Care - HMO | Admitting: Family Medicine

## 2021-12-05 ENCOUNTER — Encounter: Payer: Self-pay | Admitting: Family Medicine

## 2021-12-05 DIAGNOSIS — R062 Wheezing: Secondary | ICD-10-CM | POA: Diagnosis not present

## 2021-12-05 DIAGNOSIS — I2510 Atherosclerotic heart disease of native coronary artery without angina pectoris: Secondary | ICD-10-CM

## 2021-12-05 DIAGNOSIS — N529 Male erectile dysfunction, unspecified: Secondary | ICD-10-CM

## 2021-12-05 DIAGNOSIS — Z720 Tobacco use: Secondary | ICD-10-CM | POA: Diagnosis not present

## 2021-12-05 DIAGNOSIS — I10 Essential (primary) hypertension: Secondary | ICD-10-CM

## 2021-12-05 DIAGNOSIS — R4586 Emotional lability: Secondary | ICD-10-CM

## 2021-12-05 MED ORDER — SILDENAFIL CITRATE 100 MG PO TABS: 50.0000 mg | ORAL_TABLET | Freq: Every day | ORAL | 11 refills | Status: DC | PRN

## 2021-12-05 MED ORDER — ALBUTEROL SULFATE HFA 108 (90 BASE) MCG/ACT IN AERS: 2.0000 | INHALATION_SPRAY | Freq: Four times a day (QID) | RESPIRATORY_TRACT | 2 refills | Status: DC | PRN

## 2021-12-05 NOTE — Patient Instructions (Signed)
Good to see you today - Thank you for coming in  Things we discussed today:  ED I sent in a prescription for viagra - let me know if your insurance needs a different medication Do NOT take any nitroglycerin with the viagra  Blood pressure Continue taking your medications as you are  Shortness of breath or chest pain If you have worsening shortness of breath or chest pain that the albuterol does not help then you need to come back immediately or the chest pain does not go away then call 911  Smoking Aim to cut back a few cigarettes a day  Please always bring your medication bottles  Come back to see me in one month

## 2021-12-05 NOTE — Assessment & Plan Note (Signed)
Stable continue current medications

## 2021-12-05 NOTE — Assessment & Plan Note (Signed)
Much improved  Continue current medications

## 2021-12-05 NOTE — Assessment & Plan Note (Signed)
No change.  Not interested in therapist due time and transportation (lives in [place])

## 2021-12-05 NOTE — Assessment & Plan Note (Signed)
Will give trial of viagra.   See after visit summary cautions

## 2021-12-05 NOTE — Assessment & Plan Note (Signed)
Smoking 1 ppd.  Has for years.  Wants to stop but feels now not a good time with stress of no job or transportation

## 2021-12-05 NOTE — Assessment & Plan Note (Signed)
Very likely COPD.   Continue to work on smoking cessation.   Trial of albuterol which he used before.  Likely PFTs in the future

## 2022-03-27 ENCOUNTER — Ambulatory Visit (INDEPENDENT_AMBULATORY_CARE_PROVIDER_SITE_OTHER): Payer: Commercial Managed Care - HMO | Admitting: Family Medicine

## 2022-03-27 ENCOUNTER — Encounter: Payer: Self-pay | Admitting: Family Medicine

## 2022-03-27 ENCOUNTER — Other Ambulatory Visit: Payer: Self-pay

## 2022-03-27 VITALS — BP 140/101 | HR 80 | Ht 71.0 in | Wt 239.4 lb

## 2022-03-27 DIAGNOSIS — N529 Male erectile dysfunction, unspecified: Secondary | ICD-10-CM

## 2022-03-27 DIAGNOSIS — I2510 Atherosclerotic heart disease of native coronary artery without angina pectoris: Secondary | ICD-10-CM

## 2022-03-27 DIAGNOSIS — R062 Wheezing: Secondary | ICD-10-CM

## 2022-03-27 DIAGNOSIS — Z720 Tobacco use: Secondary | ICD-10-CM

## 2022-03-27 DIAGNOSIS — I10 Essential (primary) hypertension: Secondary | ICD-10-CM | POA: Diagnosis not present

## 2022-03-27 MED ORDER — ATORVASTATIN CALCIUM 80 MG PO TABS: 80.0000 mg | ORAL_TABLET | Freq: Every day | ORAL | 1 refills | Status: DC

## 2022-03-27 MED ORDER — SILDENAFIL CITRATE 100 MG PO TABS: 50.0000 mg | ORAL_TABLET | Freq: Every day | ORAL | 0 refills | Status: DC | PRN

## 2022-03-27 MED ORDER — SERTRALINE HCL 50 MG PO TABS: 50.0000 mg | ORAL_TABLET | Freq: Every day | ORAL | 1 refills | Status: DC

## 2022-03-27 MED ORDER — ASPIRIN 81 MG PO TBEC: 81.0000 mg | DELAYED_RELEASE_TABLET | Freq: Every day | ORAL | 1 refills | Status: DC

## 2022-03-27 MED ORDER — ALBUTEROL SULFATE HFA 108 (90 BASE) MCG/ACT IN AERS: 2.0000 | INHALATION_SPRAY | Freq: Four times a day (QID) | RESPIRATORY_TRACT | 2 refills | Status: DC | PRN

## 2022-03-27 MED ORDER — CLOPIDOGREL BISULFATE 75 MG PO TABS: 75.0000 mg | ORAL_TABLET | Freq: Every day | ORAL | 1 refills | Status: DC

## 2022-03-27 MED ORDER — HYDROCHLOROTHIAZIDE 25 MG PO TABS: 25.0000 mg | ORAL_TABLET | Freq: Every day | ORAL | 1 refills | Status: DC

## 2022-03-27 MED ORDER — METOPROLOL SUCCINATE ER 25 MG PO TB24: 25.0000 mg | ORAL_TABLET | Freq: Every day | ORAL | 1 refills | Status: DC

## 2022-03-27 NOTE — Assessment & Plan Note (Signed)
Encouraged to stop and gave [place] Quit line

## 2022-03-27 NOTE — Assessment & Plan Note (Signed)
Poorly controlled.  Restart medications and monitor closely

## 2022-03-27 NOTE — Patient Instructions (Signed)
Good to see you today - Thank you for coming in  Things we discussed today:  Blood pressure Take all your medications every day  Breathing Use the inhaler twice a day as needed  You need to stop smoking!  Call [place] (1-800-QUIT-NOW) for ideas  Choose a quit date when you will stop completely.  Get prepared by slowly cutting down.  Find a substitute to use when you think you need a cigarette.  If you wish to discuss nicotine replacement (patches, gum) please make an appointment   Call me if you do not get your medications   I have put in a referral to Cardiology.  They should call you to schedule an appointment.  This could appear as an unknown number on your phone.  If you have not heard from them in 2 weeks please let me know.  Please always bring your medication bottles  Do not use viagra for one week until you are taking all your medications and do not use if are having chest pain with exertion  Come back to see me in 1 month

## 2022-03-27 NOTE — Assessment & Plan Note (Signed)
Recommend not using viagra until he has taken his blood pressure medications for a week and not to at all if he is having any exertional chest pain or shortness of breath or is using nitrates

## 2022-03-27 NOTE — Assessment & Plan Note (Signed)
Not controlled.   I do not think his cramps are directly related to CAD but encouraged him to restart all his cardiac medications.    I placed his old records from prior cardiologist when he was incarcerated in our scan pile approximately a month ago but do not see them in media.  Will refer to cardiology Dr Percival Spanish who his mother sees.

## 2022-03-27 NOTE — Progress Notes (Signed)
SUBJECTIVE:   CHIEF COMPLAINT / HPI:   Has been out of his medications for about a month.  His girlfriend who is with him today urged him to come in  Essential hypertension Brings in empty bottles.     Wheezing Continues to smoke about 1/2 ppd. Has been out of albuterol.  No fever or sputum    CAD (coronary artery disease) Out of all medications including asa.  Has pain in his chest and shoulders and back described as cramping that happens at rest with exertion.  Does not seem reproducible with activity.  Would like referral to cardiology    Mood disturbance Feels under stress.  Not working.  Felt sertraline helped when he was taking and would like to go back on.        OBJECTIVE:   BP (!) 140/101   Pulse 80   Ht 5' 11"$  (1.803 m)   Wt 239 lb 6.4 oz (108.6 kg)   SpO2 97%   BMI 33.39 kg/m   Heart - Regular rate and rhythm.  No murmurs, gallops or rubs.    Lungs:  Normal respiratory effort, chest expands symmetrically. Lungs are clear to auscultation, no crackles or wheezes. Extremities:  No cyanosis, edema, or deformity noted with good range of motion of all major joints.    Groin - slight fullness in L inguinal crease with cough.  No mass   ASSESSMENT/PLAN:   Essential hypertension Assessment & Plan: Poorly controlled.  Restart medications and monitor closely    Wheezing -     Albuterol Sulfate HFA; Inhale 2 puffs into the lungs every 6 (six) hours as needed for wheezing or shortness of breath.  Dispense: 8 g; Refill: 2  Coronary artery disease involving native heart without angina pectoris, unspecified vessel or lesion type Assessment & Plan: Not controlled.   I do not think his cramps are directly related to CAD but encouraged him to restart all his cardiac medications.    I placed his old records from prior cardiologist when he was incarcerated in our scan pile approximately a month ago but do not see them in media.  Will refer to cardiology Dr Percival Spanish who his  mother sees.   Orders: -     Ambulatory referral to Cardiology  Erectile dysfunction, unspecified erectile dysfunction type Assessment & Plan: Recommend not using viagra until he has taken his blood pressure medications for a week and not to at all if he is having any exertional chest pain or shortness of breath or is using nitrates   Orders: -     Sildenafil Citrate; Take 0.5-1 tablets (50-100 mg total) by mouth daily as needed for erectile dysfunction.  Dispense: 10 tablet; Refill: 0  Tobacco abuse Assessment & Plan: Encouraged to stop and gave [facility] Quit line    Other orders -     Metoprolol Succinate ER; Take 1 tablet (25 mg total) by mouth daily.  Dispense: 90 tablet; Refill: 1 -     Atorvastatin Calcium; Take 1 tablet (80 mg total) by mouth daily.  Dispense: 90 tablet; Refill: 1 -     Sertraline HCl; Take 1 tablet (50 mg total) by mouth daily.  Dispense: 90 tablet; Refill: 1 -     Clopidogrel Bisulfate; Take 1 tablet (75 mg total) by mouth daily.  Dispense: 90 tablet; Refill: 1 -     Aspirin; Take 1 tablet (81 mg total) by mouth daily. Swallow whole.  Dispense: 90 tablet; Refill: 1 -  hydroCHLOROthiazide; Take 1 tablet (25 mg total) by mouth daily.  Dispense: 90 tablet; Refill: 1     Patient Instructions  Good to see you today - Thank you for coming in  Things we discussed today:  Blood pressure Take all your medications every day  Breathing Use the inhaler twice a day as needed  You need to stop smoking!  Call [facility] (1-800-QUIT-NOW) for ideas  Choose a quit date when you will stop completely.  Get prepared by slowly cutting down.  Find a substitute to use when you think you need a cigarette.  If you wish to discuss nicotine replacement (patches, gum) please make an appointment   Call me if you do not get your medications   I have put in a referral to Cardiology.  They should call you to schedule an appointment.  This could appear as an unknown  number on your phone.  If you have not heard from them in 2 weeks please let me know.  Please always bring your medication bottles  Do not use viagra for one week until you are taking all your medications and do not use if are having chest pain with exertion  Come back to see me in 1 month      Lind Covert, [place]
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
## 2022-04-09 ENCOUNTER — Encounter: Payer: Self-pay | Admitting: Cardiology

## 2022-04-09 ENCOUNTER — Telehealth: Payer: Self-pay | Admitting: Cardiology

## 2022-04-09 ENCOUNTER — Ambulatory Visit: Payer: Commercial Managed Care - HMO | Attending: Cardiology | Admitting: Cardiology

## 2022-04-09 VITALS — BP 130/82 | HR 75 | Ht 71.0 in | Wt 245.8 lb

## 2022-04-09 DIAGNOSIS — Z131 Encounter for screening for diabetes mellitus: Secondary | ICD-10-CM

## 2022-04-09 DIAGNOSIS — K625 Hemorrhage of anus and rectum: Secondary | ICD-10-CM | POA: Diagnosis not present

## 2022-04-09 DIAGNOSIS — R079 Chest pain, unspecified: Secondary | ICD-10-CM | POA: Diagnosis not present

## 2022-04-09 DIAGNOSIS — I10 Essential (primary) hypertension: Secondary | ICD-10-CM

## 2022-04-09 DIAGNOSIS — R1031 Right lower quadrant pain: Secondary | ICD-10-CM | POA: Diagnosis not present

## 2022-04-09 DIAGNOSIS — I2510 Atherosclerotic heart disease of native coronary artery without angina pectoris: Secondary | ICD-10-CM

## 2022-04-09 DIAGNOSIS — E785 Hyperlipidemia, unspecified: Secondary | ICD-10-CM

## 2022-04-09 MED ORDER — NICOTINE 7 MG/24HR TD PT24: 7.0000 mg | MEDICATED_PATCH | Freq: Every day | TRANSDERMAL | 0 refills | Status: DC

## 2022-04-09 MED ORDER — NITROGLYCERIN 0.4 MG SL SUBL: 0.4000 mg | SUBLINGUAL_TABLET | SUBLINGUAL | 3 refills | Status: DC | PRN

## 2022-04-09 NOTE — Patient Instructions (Addendum)
Medication Instructions:  Hold sildenafil  STOP Plavix  Start nicotine patch-7 mg daily x 4 weeks, then stop.   *If you need a refill on your cardiac medications before your next appointment, please call your pharmacy*   Lab Work: CMET, CBC, Lipid, A1C today  If you have labs (blood work) drawn today and your tests are completely normal, you will receive your results only by: [place] (if you have MyChart) OR A paper copy in the mail If you have any lab test that is abnormal or we need to change your treatment, we will call you to review the results.   Testing/Procedures: Vascular groin ultrasound-r/o pseudoaneurysm  Your physician has requested that you have an echocardiogram. Echocardiography is a painless test that uses sound waves to create images of your heart. It provides your doctor with information about the size and shape of your heart and how well your heart's chambers and valves are working. This procedure takes approximately one hour. There are no restrictions for this procedure. Please do NOT wear cologne, perfume, aftershave, or lotions (deodorant is allowed). Please arrive 15 minutes prior to your appointment time.  CARDIAC PET- Your physician has requested that you have a Cardiac Pet Stress Test. This testing is completed at [facility] ([place], [place] [address]). The schedulers will call you to get this scheduled. Please follow instructions below and call the office with any questions/concerns [phone]).   Follow-Up: At [facility], you and your health needs are our priority.  As part of our continuing mission to provide you with exceptional heart care, we have created designated Provider Care Teams.  These Care Teams include your primary Cardiologist (physician) and Advanced Practice Providers (APPs -  Physician Assistants and Nurse Practitioners) who all work together to provide you with the care you need,  when you need it.  We recommend signing up for the patient portal called "MyChart".  Sign up information is provided on this After Visit Summary.  MyChart is used to connect with patients for Virtual Visits (Telemedicine).  Patients are able to view lab/test results, encounter notes, upcoming appointments, etc.  Non-urgent messages can be sent to your provider as well.   To learn more about what you can do with MyChart, go to [email or web address].    Your next appointment:   3 month(s)  Provider:   Dr. Gardiner Rhyme Other Instructions You have been referred to-Gastroenterology  How to Prepare for Your Cardiac PET/CT Stress Test:  1. Please do not take these medications before your test:   Medications that may interfere with the cardiac pharmacological stress agent (ex. nitrates - including erectile dysfunction medications, isosorbide mononitrate, tamulosin or beta-blockers) the day of the exam. (Erectile dysfunction medication should be held for at least 72 hrs prior to test) Theophylline containing medications for 12 hours. Dipyridamole 48 hours prior to the test. Your remaining medications may be taken with water.  2. Nothing to eat or drink, except water, 3 hours prior to arrival time.   NO caffeine/decaffeinated products, or chocolate 12 hours prior to arrival.  3. NO perfume, cologne or lotion  4. Total time is 1 to 2 hours; you may want to bring reading material for the waiting time.  5. Please report to Radiology at the  [facility] Main Entrance 30 minutes early for your test.  [place], [place] [address]  Diabetic Preparation:  Hold oral medications. You may take NPH and Lantus insulin. Do  not take Humalog or Humulin R (Regular Insulin) the day of your test. Check blood sugars prior to leaving the house. If able to eat breakfast prior to 3 hour fasting, you may take all medications, including your insulin, Do not worry if you miss your breakfast  dose of insulin - start at your next meal.  IF YOU THINK YOU MAY BE PREGNANT, OR ARE NURSING PLEASE INFORM THE TECHNOLOGIST.  In preparation for your appointment, medication and supplies will be purchased.  Appointment availability is limited, so if you need to cancel or reschedule, please call the Radiology Department at [phone]  24 hours in advance to avoid a cancellation fee of $100.00  What to Expect After you Arrive:  Once you arrive and check in for your appointment, you will be taken to a preparation room within the Radiology Department.  A technologist or Nurse will obtain your medical history, verify that you are correctly prepped for the exam, and explain the procedure.  Afterwards,  an IV will be started in your arm and electrodes will be placed on your skin for EKG monitoring during the stress portion of the exam. Then you will be escorted to the PET/CT scanner.  There, staff will get you positioned on the scanner and obtain a blood pressure and EKG.  During the exam, you will continue to be connected to the EKG and blood pressure machines.  A small, safe amount of a radioactive tracer will be injected in your IV to obtain a series of pictures of your heart along with an injection of a stress agent.    After your Exam:  It is recommended that you eat a meal and drink a caffeinated beverage to counter act any effects of the stress agent.  Drink plenty of fluids for the remainder of the day and urinate frequently for the first couple of hours after the exam.  Your doctor will inform you of your test results within 7-10 business days.  For questions about your test or how to prepare for your test, please call: Marchia Bond, Cardiac Imaging Nurse Navigator  Gordy Clement, Cardiac Imaging Nurse Navigator Office: [phone]

## 2022-04-09 NOTE — Telephone Encounter (Signed)
02.26.2024 Called pt and LVMTCB about Groin Vascular appointment that needs to be scheduled. BM

## 2022-04-10 LAB — LIPID PANEL
Chol/HDL Ratio: 3.1 ratio (ref 0.0–5.0)
Cholesterol, Total: 219 mg/dL — ABNORMAL HIGH (ref 100–199)
HDL: 70 mg/dL (ref >39)
LDL Chol Calc (NIH): 122 mg/dL — ABNORMAL HIGH (ref 0–99)
Triglycerides: 156 mg/dL — ABNORMAL HIGH (ref 0–149)
VLDL Cholesterol Cal: 27 mg/dL (ref 5–40)

## 2022-04-10 LAB — CBC
Hematocrit: 47.9 % (ref 37.5–51.0)
Hemoglobin: 15.7 g/dL (ref 13.0–17.7)
MCH: 31.7 pg (ref 26.6–33.0)
MCHC: 32.8 g/dL (ref 31.5–35.7)
MCV: 97 fL (ref 79–97)
Platelets: 246 10*3/uL (ref 150–450)
RBC: 4.96 x10E6/uL (ref 4.14–5.80)
RDW: 14.1 % (ref 11.6–15.4)
WBC: 9.6 10*3/uL (ref 3.4–10.8)

## 2022-04-10 LAB — COMPREHENSIVE METABOLIC PANEL
ALT: 24 IU/L (ref 0–44)
AST: 18 IU/L (ref 0–40)
Albumin/Globulin Ratio: 2 (ref 1.2–2.2)
Albumin: 4.7 g/dL (ref 3.8–4.9)
Alkaline Phosphatase: 93 IU/L (ref 44–121)
BUN/Creatinine Ratio: 11 (ref 9–20)
BUN: 14 mg/dL (ref 6–24)
Bilirubin Total: 0.3 mg/dL (ref 0.0–1.2)
CO2: 23 mmol/L (ref 20–29)
Calcium: 9.4 mg/dL (ref 8.7–10.2)
Chloride: 103 mmol/L (ref 96–106)
Creatinine, Ser: 1.27 mg/dL (ref 0.76–1.27)
Globulin, Total: 2.3 g/dL (ref 1.5–4.5)
Glucose: 112 mg/dL — ABNORMAL HIGH (ref 70–99)
Potassium: 4.3 mmol/L (ref 3.5–5.2)
Sodium: 141 mmol/L (ref 134–144)
Total Protein: 7 g/dL (ref 6.0–8.5)
eGFR: 66 mL/min/{1.73_m2} (ref >59)

## 2022-04-10 LAB — HEMOGLOBIN A1C
Est. average glucose Bld gHb Est-mCnc: 128 mg/dL
Hgb A1c MFr Bld: 6.1 % — ABNORMAL HIGH (ref 4.8–5.6)

## 2022-04-11 ENCOUNTER — Ambulatory Visit (HOSPITAL_COMMUNITY)
Admission: RE | Admit: 2022-04-11 | Payer: Commercial Managed Care - HMO | Source: Ambulatory Visit | Attending: Cardiology | Admitting: Cardiology

## 2022-04-16 ENCOUNTER — Encounter (HOSPITAL_COMMUNITY): Payer: Self-pay

## 2022-04-19 ENCOUNTER — Other Ambulatory Visit: Payer: Self-pay | Admitting: *Deleted

## 2022-04-19 DIAGNOSIS — E785 Hyperlipidemia, unspecified: Secondary | ICD-10-CM

## 2022-04-23 ENCOUNTER — Other Ambulatory Visit: Payer: Self-pay | Admitting: *Deleted

## 2022-04-23 DIAGNOSIS — E785 Hyperlipidemia, unspecified: Secondary | ICD-10-CM

## 2022-04-24 NOTE — Progress Notes (Unsigned)
    SUBJECTIVE:   CHIEF COMPLAINT / HPI:   Hypertension Did not bring in medications.  Thinks he is only taking HCTZ No edema.  No persistent chest pain with exertion  Tobacco Down to 1/2 ppd.  Using 7 mg patch without cigs at same time.   Feels he needs a higher patch  ED  Has not used viagra. Does not plan to until blood pressure better Has not used SL nitro  Mood Feels anxiety is some better.  Would like to increase sertraline Not exercising regularly  Cigs - 1/2 ppd   He did follow up with cardiology   OBJECTIVE:   BP (!) 156/92   Pulse 87   Ht 5\' 11"  (1.803 m)   Wt 238 lb 9.6 oz (108.2 kg)   SpO2 97%   BMI 33.28 kg/m   Heart - Regular rate and rhythm.  No murmurs, gallops or rubs.    Lungs:  Normal respiratory effort, chest expands symmetrically. Lungs are clear to auscultation, no crackles or wheezes. Psych:  Cognition and judgment appear intact. Alert, communicative  and cooperative with normal attention span and concentration. No apparent delusions, illusions, hallucinations No pedal edema  ASSESSMENT/PLAN:   Essential hypertension Assessment & Plan: Not at goal.  Represcribed metoprolol.  Asked them to double check medications and bring in next visit    Tobacco abuse Assessment & Plan: Some improvement.  Increased nicotine patch to 14 mg and is not to smoke when wearing   Mood disturbance Assessment & Plan: Some improvement.  Increase sertraline to 100 mg treatment dose Recommend regular exercise    Other orders -     Metoprolol Succinate ER; Take 1 tablet (25 mg total) by mouth daily.  Dispense: 90 tablet; Refill: 0 -     Nicotine; One daily.  Do smoke when taking the patch.  Dispense: 28 patch; Refill: 0 -     Sertraline HCl; Take 1 tablet (100 mg total) by mouth daily.  Dispense: 30 tablet; Refill: 1     Patient Instructions  Good to see you today - Thank you for coming in  Things we discussed today:  Your goal blood pressure is less  than 140/90.  Check your blood pressure several times a week.  If regularly higher than this please let me know - either with MyChart or leaving a phone message. Next visit please bring in your blood pressure cuff.      You need to stop smoking!  Call [place] (1-800-QUIT-NOW) for ideas  Choose a quit date when you will stop completely.  Get prepared by slowly cutting down.  Find a substitute to use when you think you need a cigarette.  If you wish to discuss nicotine replacement (patches, gum) please make an appointment   You should exercise at least 20 minutes every day.    Choose something you like the most or hate the least.   Having a set time every day and having a partner will help you stick to it.    If you are too tired try to do at least 5 minutes, it often gets easier.   Please always bring your medication bottles  Come back to see me in 1 month    Lind Covert, MD [place]

## 2022-04-25 ENCOUNTER — Ambulatory Visit (INDEPENDENT_AMBULATORY_CARE_PROVIDER_SITE_OTHER): Payer: Commercial Managed Care - HMO | Admitting: Family Medicine

## 2022-04-25 ENCOUNTER — Encounter: Payer: Self-pay | Admitting: Family Medicine

## 2022-04-25 ENCOUNTER — Other Ambulatory Visit: Payer: Self-pay

## 2022-04-25 VITALS — BP 156/92 | HR 87 | Ht 71.0 in | Wt 238.6 lb

## 2022-04-25 DIAGNOSIS — Z720 Tobacco use: Secondary | ICD-10-CM | POA: Diagnosis not present

## 2022-04-25 DIAGNOSIS — I10 Essential (primary) hypertension: Secondary | ICD-10-CM | POA: Diagnosis not present

## 2022-04-25 DIAGNOSIS — R4586 Emotional lability: Secondary | ICD-10-CM

## 2022-04-25 MED ORDER — NICOTINE 14 MG/24HR TD PT24: MEDICATED_PATCH | TRANSDERMAL | 0 refills | Status: DC

## 2022-04-25 MED ORDER — SERTRALINE HCL 100 MG PO TABS: 100.0000 mg | ORAL_TABLET | Freq: Every day | ORAL | 1 refills | Status: DC

## 2022-04-25 MED ORDER — METOPROLOL SUCCINATE ER 25 MG PO TB24: 25.0000 mg | ORAL_TABLET | Freq: Every day | ORAL | 0 refills | Status: DC

## 2022-04-25 NOTE — Patient Instructions (Signed)
Good to see you today - Thank you for coming in  Things we discussed today:  Your goal blood pressure is less than 140/90.  Check your blood pressure several times a week.  If regularly higher than this please let me know - either with MyChart or leaving a phone message. Next visit please bring in your blood pressure cuff.      You need to stop smoking!  Call [place] (1-800-QUIT-NOW) for ideas  Choose a quit date when you will stop completely.  Get prepared by slowly cutting down.  Find a substitute to use when you think you need a cigarette.  If you wish to discuss nicotine replacement (patches, gum) please make an appointment   You should exercise at least 20 minutes every day.    Choose something you like the most or hate the least.   Having a set time every day and having a partner will help you stick to it.    If you are too tired try to do at least 5 minutes, it often gets easier.   Please always bring your medication bottles  Come back to see me in 1 month

## 2022-04-25 NOTE — Assessment & Plan Note (Signed)
Some improvement.  Increase sertraline to 100 mg treatment dose Recommend regular exercise

## 2022-04-25 NOTE — Assessment & Plan Note (Signed)
Not at goal.  Represcribed metoprolol.  Asked them to double check medications and bring in next visit

## 2022-04-25 NOTE — Assessment & Plan Note (Signed)
Some improvement.  Increased nicotine patch to 14 mg and is not to smoke when wearing

## 2022-05-09 ENCOUNTER — Ambulatory Visit (HOSPITAL_COMMUNITY): Payer: Commercial Managed Care - HMO | Attending: Cardiology

## 2022-05-09 DIAGNOSIS — I351 Nonrheumatic aortic (valve) insufficiency: Secondary | ICD-10-CM | POA: Diagnosis not present

## 2022-05-09 DIAGNOSIS — I2510 Atherosclerotic heart disease of native coronary artery without angina pectoris: Secondary | ICD-10-CM | POA: Insufficient documentation

## 2022-05-09 DIAGNOSIS — I517 Cardiomegaly: Secondary | ICD-10-CM | POA: Diagnosis not present

## 2022-05-09 DIAGNOSIS — R079 Chest pain, unspecified: Secondary | ICD-10-CM

## 2022-05-09 LAB — ECHOCARDIOGRAM COMPLETE
Area-P 1/2: 3.26 cm2
P 1/2 time: 589 msec
S' Lateral: 5.5 cm
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
## 2022-05-11 ENCOUNTER — Ambulatory Visit
Admission: RE | Admit: 2022-05-11 | Discharge: 2022-05-11 | Disposition: A | Discharge status: Home / Self Care | Payer: Commercial Managed Care - HMO | Source: Ambulatory Visit | Attending: Cardiology | Admitting: Cardiology

## 2022-05-11 ENCOUNTER — Encounter: Payer: Self-pay | Admitting: Cardiology

## 2022-05-11 ENCOUNTER — Ambulatory Visit (INDEPENDENT_AMBULATORY_CARE_PROVIDER_SITE_OTHER): Payer: Commercial Managed Care - HMO | Admitting: Cardiology

## 2022-05-11 VITALS — BP 154/82 | HR 80 | Ht 71.0 in | Wt 241.8 lb

## 2022-05-11 DIAGNOSIS — Z720 Tobacco use: Secondary | ICD-10-CM

## 2022-05-11 DIAGNOSIS — I5042 Chronic combined systolic (congestive) and diastolic (congestive) heart failure: Secondary | ICD-10-CM | POA: Diagnosis not present

## 2022-05-11 DIAGNOSIS — R079 Chest pain, unspecified: Secondary | ICD-10-CM | POA: Diagnosis not present

## 2022-05-11 DIAGNOSIS — I2510 Atherosclerotic heart disease of native coronary artery without angina pectoris: Secondary | ICD-10-CM | POA: Insufficient documentation

## 2022-05-11 DIAGNOSIS — R1031 Right lower quadrant pain: Secondary | ICD-10-CM | POA: Diagnosis present

## 2022-05-11 DIAGNOSIS — I10 Essential (primary) hypertension: Secondary | ICD-10-CM

## 2022-05-11 MED ORDER — LOSARTAN POTASSIUM 25 MG PO TABS: 25.0000 mg | ORAL_TABLET | Freq: Every day | ORAL | 3 refills | Status: DC

## 2022-05-11 NOTE — Patient Instructions (Addendum)
Medication Instructions:  START: LOSARTAN 25mg  ONCE DAILY  *If you need a refill on your cardiac medications before your next appointment, please call your pharmacy*  Lab Work:  BLOOD WORK TODAY  THEN Please return for Blood Work in 1 WEEK ([place] 4/5). No appointment needed, lab here at the office is open Monday-Friday from 8AM to 4PM and closed daily for lunch from 12:45-1:45.   If you have labs (blood work) drawn today and your tests are completely normal, you will receive your results only by: [place] (if you have MyChart) OR A paper copy in the mail If you have any lab test that is abnormal or we need to change your treatment, we will call you to review the results.  Testing/Procedures: Your physician has requested that you have a cardiac catheterization. Cardiac catheterization is used to diagnose and/or treat various heart conditions. Doctors may recommend this procedure for a number of different reasons. The most common reason is to evaluate chest pain. Chest pain can be a symptom of coronary artery disease (CAD), and cardiac catheterization can show whether plaque is narrowing or blocking your heart's arteries. This procedure is also used to evaluate the valves, as well as measure the blood flow and oxygen levels in different parts of your heart. For further information please visit HugeFiesta.[email or web address]. Please follow instruction sheet, as given.   Follow-Up: At [facility], you and your health needs are our priority.  As part of our continuing mission to provide you with exceptional heart care, we have created designated Provider Care Teams.  These Care Teams include your primary Cardiologist (physician) and Advanced Practice Providers (APPs -  Physician Assistants and Nurse Practitioners) who all work together to provide you with the care you need, when you need it.  Your next appointment:   AS SCHEDULED   Provider:   Donato Heinz, MD     Other  Instructions  [place] A DEPT OF [place] AT [facility] [address] [place] [id number] [place] [address] Dept: [phone] Loc: [phone]  Grant Huff  05/11/2022  You are scheduled for a Cardiac Catheterization on Thursday, April 4 with Dr. Peter Martinique.  1. Please arrive at the [facility] (Main Entrance A) at [facility]: [address] [place], [place] [address] at 7:00 AM (This time is two hours before your procedure to ensure your preparation). Free valet parking service is available.   Special note: Every effort is made to have your procedure done on time. Please understand that emergencies sometimes delay scheduled procedures.  2. Diet: Do not eat solid foods after midnight.  The patient may have clear liquids until 5am upon the day of the procedure.  3. Labs: You will need to have blood drawn on TODAY   4. Medication instructions in preparation for your procedure:   Contrast Allergy: No  Stop taking, HTCZ (Hydrochlorothiazide) Thursday, April 4,  On the morning of your procedure, take your Aspirin 81 mg and any morning medicines NOT listed above.  You may use sips of water.  5. Plan for one night stay--bring personal belongings. 6. Bring a current list of your medications and current insurance cards. 7. You MUST have a responsible person to drive you home. 8. Someone MUST be with you the first 24 hours after you arrive home or your discharge will be delayed. 9. Please wear clothes that are easy to get on and off and wear slip-on  shoes.  Thank you for allowing [place] to care for you!   --  Invasive Cardiovascular services

## 2022-05-12 LAB — CBC
Hematocrit: 49.1 % (ref 37.5–51.0)
Hemoglobin: 16.4 g/dL (ref 13.0–17.7)
MCH: 31.4 pg (ref 26.6–33.0)
MCHC: 33.4 g/dL (ref 31.5–35.7)
MCV: 94 fL (ref 79–97)
Platelets: 253 10*3/uL (ref 150–450)
RBC: 5.22 x10E6/uL (ref 4.14–5.80)
RDW: 13.7 % (ref 11.6–15.4)
WBC: 10.9 10*3/uL — ABNORMAL HIGH (ref 3.4–10.8)

## 2022-05-12 LAB — BASIC METABOLIC PANEL
BUN/Creatinine Ratio: 12 (ref 9–20)
BUN: 15 mg/dL (ref 6–24)
CO2: 24 mmol/L (ref 20–29)
Calcium: 10.1 mg/dL (ref 8.7–10.2)
Chloride: 100 mmol/L (ref 96–106)
Creatinine, Ser: 1.25 mg/dL (ref 0.76–1.27)
Glucose: 85 mg/dL (ref 70–99)
Potassium: 4.2 mmol/L (ref 3.5–5.2)
Sodium: 142 mmol/L (ref 134–144)
eGFR: 67 mL/min/{1.73_m2} (ref >59)

## 2022-05-17 ENCOUNTER — Ambulatory Visit (HOSPITAL_COMMUNITY)
Admission: RE | Admit: 2022-05-17 | Discharge: 2022-05-17 | Disposition: A | Discharge status: Home / Self Care | Payer: Commercial Managed Care - HMO | Attending: Cardiology | Admitting: Cardiology

## 2022-05-17 ENCOUNTER — Encounter (HOSPITAL_COMMUNITY)
Admission: RE | Disposition: A | Discharge status: Home / Self Care | Payer: Self-pay | Source: Home / Self Care | Attending: Cardiology

## 2022-05-17 ENCOUNTER — Encounter (HOSPITAL_COMMUNITY): Payer: Self-pay | Admitting: Cardiology

## 2022-05-17 DIAGNOSIS — Z7982 Long term (current) use of aspirin: Secondary | ICD-10-CM | POA: Insufficient documentation

## 2022-05-17 DIAGNOSIS — I110 Hypertensive heart disease with heart failure: Secondary | ICD-10-CM | POA: Insufficient documentation

## 2022-05-17 DIAGNOSIS — I2582 Chronic total occlusion of coronary artery: Secondary | ICD-10-CM | POA: Diagnosis not present

## 2022-05-17 DIAGNOSIS — I5042 Chronic combined systolic (congestive) and diastolic (congestive) heart failure: Secondary | ICD-10-CM | POA: Insufficient documentation

## 2022-05-17 DIAGNOSIS — I351 Nonrheumatic aortic (valve) insufficiency: Secondary | ICD-10-CM | POA: Insufficient documentation

## 2022-05-17 DIAGNOSIS — Z79899 Other long term (current) drug therapy: Secondary | ICD-10-CM | POA: Diagnosis not present

## 2022-05-17 DIAGNOSIS — E785 Hyperlipidemia, unspecified: Secondary | ICD-10-CM | POA: Insufficient documentation

## 2022-05-17 DIAGNOSIS — I25118 Atherosclerotic heart disease of native coronary artery with other forms of angina pectoris: Secondary | ICD-10-CM | POA: Insufficient documentation

## 2022-05-17 DIAGNOSIS — Z955 Presence of coronary angioplasty implant and graft: Secondary | ICD-10-CM | POA: Diagnosis not present

## 2022-05-17 DIAGNOSIS — F17210 Nicotine dependence, cigarettes, uncomplicated: Secondary | ICD-10-CM | POA: Insufficient documentation

## 2022-05-17 DIAGNOSIS — R1031 Right lower quadrant pain: Secondary | ICD-10-CM | POA: Diagnosis not present

## 2022-05-17 HISTORY — PX: RIGHT/LEFT HEART CATH AND CORONARY ANGIOGRAPHY: CATH118266

## 2022-05-17 LAB — POCT I-STAT EG7
Acid-base deficit: 1 mmol/L (ref 0.0–2.0)
Acid-base deficit: 1 mmol/L (ref 0.0–2.0)
Acid-base deficit: 2 mmol/L (ref 0.0–2.0)
Bicarbonate: 23.5 mmol/L (ref 20.0–28.0)
Bicarbonate: 24.7 mmol/L (ref 20.0–28.0)
Bicarbonate: 25.6 mmol/L (ref 20.0–28.0)
Calcium, Ion: 1.21 mmol/L (ref 1.15–1.40)
Calcium, Ion: 1.21 mmol/L (ref 1.15–1.40)
Calcium, Ion: 1.27 mmol/L (ref 1.15–1.40)
HCT: 42 % (ref 39.0–52.0)
HCT: 42 % (ref 39.0–52.0)
HCT: 43 % (ref 39.0–52.0)
Hemoglobin: 14.3 g/dL (ref 13.0–17.0)
Hemoglobin: 14.3 g/dL (ref 13.0–17.0)
Hemoglobin: 14.6 g/dL (ref 13.0–17.0)
O2 Saturation: 65 %
O2 Saturation: 66 %
O2 Saturation: 93 %
Potassium: 3.9 mmol/L (ref 3.5–5.1)
Potassium: 4 mmol/L (ref 3.5–5.1)
Potassium: 4.1 mmol/L (ref 3.5–5.1)
Sodium: 141 mmol/L (ref 135–145)
Sodium: 141 mmol/L (ref 135–145)
Sodium: 142 mmol/L (ref 135–145)
TCO2: 25 mmol/L (ref 22–32)
TCO2: 26 mmol/L (ref 22–32)
TCO2: 27 mmol/L (ref 22–32)
pCO2, Ven: 40 mmHg — ABNORMAL LOW (ref 44–60)
pCO2, Ven: 45.6 mmHg (ref 44–60)
pCO2, Ven: 47 mmHg (ref 44–60)
pH, Ven: 7.342 (ref 7.25–7.43)
pH, Ven: 7.344 (ref 7.25–7.43)
pH, Ven: 7.376 (ref 7.25–7.43)
pO2, Ven: 36 mmHg (ref 32–45)
pO2, Ven: 36 mmHg (ref 32–45)
pO2, Ven: 69 mmHg — ABNORMAL HIGH (ref 32–45)

## 2022-05-17 SURGERY — RIGHT/LEFT HEART CATH AND CORONARY ANGIOGRAPHY
Anesthesia: LOCAL

## 2022-05-17 MED ORDER — SODIUM CHLORIDE 0.9% FLUSH: 3.0000 mL | INTRAVENOUS | Status: DC | PRN

## 2022-05-17 MED ORDER — SODIUM CHLORIDE 0.9 % IV SOLN: 250.0000 mL | INTRAVENOUS | Status: DC | PRN

## 2022-05-17 MED ORDER — ASPIRIN 81 MG PO CHEW: 81.0000 mg | CHEWABLE_TABLET | ORAL | Status: DC

## 2022-05-17 MED ORDER — LIDOCAINE HCL (PF) 1 % IJ SOLN
INTRAMUSCULAR | Status: DC | PRN
  Administered 2022-05-17 (×2): 2 mL

## 2022-05-17 MED ORDER — SODIUM CHLORIDE 0.9% FLUSH: 3.0000 mL | Freq: Two times a day (BID) | INTRAVENOUS | Status: DC

## 2022-05-17 MED ORDER — SODIUM CHLORIDE 0.9 % IV SOLN: INTRAVENOUS | Status: DC

## 2022-05-17 MED ORDER — MIDAZOLAM HCL 2 MG/2ML IJ SOLN
INTRAMUSCULAR | Status: DC | PRN
  Administered 2022-05-17: 2 mg via INTRAVENOUS

## 2022-05-17 MED ORDER — FENTANYL CITRATE (PF) 100 MCG/2ML IJ SOLN
INTRAMUSCULAR | Status: DC | PRN
  Administered 2022-05-17 (×2): 25 ug via INTRAVENOUS

## 2022-05-17 MED ORDER — HEPARIN SODIUM (PORCINE) 1000 UNIT/ML IJ SOLN
INTRAMUSCULAR | Status: DC | PRN
  Administered 2022-05-17: 6000 [IU] via INTRAVENOUS

## 2022-05-17 MED ORDER — HEPARIN (PORCINE) IN NACL 1000-0.9 UT/500ML-% IV SOLN
INTRAVENOUS | Status: DC | PRN
  Administered 2022-05-17 (×2): 500 mL

## 2022-05-17 MED ORDER — IOHEXOL 350 MG/ML SOLN
INTRAVENOUS | Status: DC | PRN
  Administered 2022-05-17: 60 mL

## 2022-05-17 MED ORDER — MIDAZOLAM HCL 2 MG/2ML IJ SOLN
INTRAMUSCULAR | Status: AC
  Filled 2022-05-17: qty 2

## 2022-05-17 MED ORDER — VERAPAMIL HCL 2.5 MG/ML IV SOLN
INTRAVENOUS | Status: DC | PRN
  Administered 2022-05-17: 10 mL via INTRA_ARTERIAL

## 2022-05-17 MED ORDER — VERAPAMIL HCL 2.5 MG/ML IV SOLN
INTRAVENOUS | Status: AC
  Filled 2022-05-17: qty 2

## 2022-05-17 MED ORDER — LIDOCAINE HCL (PF) 1 % IJ SOLN
INTRAMUSCULAR | Status: AC
  Filled 2022-05-17: qty 30

## 2022-05-17 MED ORDER — SODIUM CHLORIDE 0.9 % WEIGHT BASED INFUSION: 1.0000 mL/kg/h | INTRAVENOUS | Status: DC

## 2022-05-17 MED ORDER — ACETAMINOPHEN 325 MG PO TABS: 650.0000 mg | ORAL_TABLET | ORAL | Status: DC | PRN

## 2022-05-17 MED ORDER — HEPARIN SODIUM (PORCINE) 1000 UNIT/ML IJ SOLN
INTRAMUSCULAR | Status: AC
  Filled 2022-05-17: qty 10

## 2022-05-17 MED ORDER — FENTANYL CITRATE (PF) 100 MCG/2ML IJ SOLN
INTRAMUSCULAR | Status: AC
  Filled 2022-05-17: qty 2

## 2022-05-17 MED ORDER — ONDANSETRON HCL 4 MG/2ML IJ SOLN: 4.0000 mg | Freq: Four times a day (QID) | INTRAMUSCULAR | Status: DC | PRN

## 2022-05-17 MED ORDER — HYDRALAZINE HCL 20 MG/ML IJ SOLN: 10.0000 mg | INTRAMUSCULAR | Status: DC | PRN

## 2022-05-17 SURGICAL SUPPLY — 11 items
CATH 5FR JL3.5 JR4 ANG PIG MP (CATHETERS) IMPLANT
CATH BALLN WEDGE 5F 110CM (CATHETERS) IMPLANT
DEVICE RAD COMP TR BAND LRG (VASCULAR PRODUCTS) IMPLANT
GLIDESHEATH SLEND SS 6F .021 (SHEATH) IMPLANT
GUIDEWIRE INQWIRE 1.5J.035X260 (WIRE) IMPLANT
INQWIRE 1.5J .035X260CM (WIRE) ×1
KIT HEART LEFT (KITS) ×1 IMPLANT
PACK CARDIAC CATHETERIZATION (CUSTOM PROCEDURE TRAY) ×1 IMPLANT
SHEATH GLIDE SLENDER 4/5FR (SHEATH) IMPLANT
TRANSDUCER W/STOPCOCK (MISCELLANEOUS) ×1 IMPLANT
TUBING CIL FLEX 10 FLL-RA (TUBING) ×1 IMPLANT

## 2022-05-17 NOTE — Discharge Instructions (Signed)

## 2022-05-17 NOTE — Progress Notes (Signed)
Patient and wife was given discharge instructions. Both verbalized understanding. 

## 2022-05-17 NOTE — Interval H&P Note (Signed)
History and Physical Interval Note:  05/17/2022 8:26 AM  Grant Huff  has presented today for surgery, with the diagnosis of heart failure.  The various methods of treatment have been discussed with the patient and family. After consideration of risks, benefits and other options for treatment, the patient has consented to  Procedure(s): RIGHT/LEFT HEART CATH AND CORONARY ANGIOGRAPHY (N/A) as a surgical intervention.  The patient's history has been reviewed, patient examined, no change in status, stable for surgery.  I have reviewed the patient's chart and labs.  Questions were answered to the patient's satisfaction.    Cath Lab Visit (complete for each Cath Lab visit)  Clinical Evaluation Leading to the Procedure:   ACS: No.  Non-ACS:    Anginal Classification: CCS II  Anti-ischemic medical therapy: Minimal Therapy (1 class of medications)  Non-Invasive Test Results: No non-invasive testing performed  Prior CABG: No previous CABG       Grant Huff [facility] 05/17/2022 8:26 AM

## 2022-05-23 ENCOUNTER — Other Ambulatory Visit: Payer: Self-pay

## 2022-05-23 ENCOUNTER — Ambulatory Visit (INDEPENDENT_AMBULATORY_CARE_PROVIDER_SITE_OTHER): Payer: Commercial Managed Care - HMO | Admitting: Family Medicine

## 2022-05-23 VITALS — BP 134/95 | HR 87 | Ht 71.0 in | Wt 238.0 lb

## 2022-05-23 DIAGNOSIS — R062 Wheezing: Secondary | ICD-10-CM

## 2022-05-23 DIAGNOSIS — I10 Essential (primary) hypertension: Secondary | ICD-10-CM

## 2022-05-23 DIAGNOSIS — Z720 Tobacco use: Secondary | ICD-10-CM

## 2022-05-23 DIAGNOSIS — R4586 Emotional lability: Secondary | ICD-10-CM

## 2022-05-23 MED ORDER — ALBUTEROL SULFATE HFA 108 (90 BASE) MCG/ACT IN AERS
2.0000 | INHALATION_SPRAY | Freq: Four times a day (QID) | RESPIRATORY_TRACT | 2 refills | Status: DC | PRN
Start: 2022-05-23 — End: 2023-03-15

## 2022-05-23 NOTE — Assessment & Plan Note (Signed)
Likely has COPD.  Will refer to Dr Raymondo Band for PFTs and tobacco counseling

## 2022-05-23 NOTE — Patient Instructions (Addendum)
Good to see you today - Thank you for coming in  Things we discussed today:  Your goal blood pressure is less than 140/90.  Check your blood pressure several times a week.  If regularly higher than this please let me know - either with MyChart or leaving a phone message. Next visit please bring in your blood pressure cuff.     Ok to viagra  Continue to stop smoking  Make an appointment to see Dr Raymondo Band for PFTs and smoking cessation  Please always bring your medication bottles  Come back to see me in 1 month

## 2022-05-23 NOTE — Assessment & Plan Note (Signed)
Improving.  Discussed $ and decisions to buy more cigs

## 2022-05-23 NOTE — Assessment & Plan Note (Signed)
Not at goal but better. Asked them to review his medications when get home.  Also to monitor with cuff they have

## 2022-05-23 NOTE — Progress Notes (Signed)
      SUBJECTIVE:   CHIEF COMPLAINT / HPI:   Hypertension Did not bring in medications.  Thinks is taking HCTZ an another one but not sure.   No edema.  No persistent chest pain with exertion   Tobacco Down to pp week.  Using 14 mg patch.   Plans to stop completely  Mood Feels anxiety is some better and sertraline is working Not exercising regularly but plans to walk to a track    Wheezing Feels he needs to use albuterol inhaler daily   OBJECTIVE:   BP (!) 134/95   Pulse 87   Ht 5\' 11"  (1.803 m)   Wt 238 lb (108 kg)   SpO2 96%   BMI 33.19 kg/m   Heart - Regular rate and rhythm.  No murmurs, gallops or rubs.    Lungs:  Normal respiratory effort, chest expands symmetrically. Lungs are clear to auscultation, no crackles or wheezes. Extremities:  No cyanosis, edema, or deformity noted with good range of motion of all major joints.     ASSESSMENT/PLAN:   Essential hypertension Assessment & Plan: Not at goal but better. Asked them to review his medications when get home.  Also to monitor with cuff they have    Wheezing Assessment & Plan: Likely has COPD.  Will refer to Dr Raymondo Band for PFTs and tobacco counseling   Orders: -     Albuterol Sulfate HFA; Inhale 2 puffs into the lungs every 6 (six) hours as needed for wheezing or shortness of breath.  Dispense: 8 g; Refill: 2  Mood disturbance Assessment & Plan: Improving.  Recommended regular exercise    Tobacco abuse Assessment & Plan: Improving.  Discussed $ and decisions to buy more cigs       Patient Instructions  Good to see you today - Thank you for coming in  Things we discussed today:  Your goal blood pressure is less than 140/90.  Check your blood pressure several times a week.  If regularly higher than this please let me know - either with MyChart or leaving a phone message. Next visit please bring in your blood pressure cuff.     Ok to viagra  Continue to stop smoking  Make an appointment to  see Dr Raymondo Band for PFTs and smoking cessation  Please always bring your medication bottles  Come back to see me in 1 month   Carney Living, MD [facility]

## 2022-05-23 NOTE — Assessment & Plan Note (Signed)
Improving.  Recommended regular exercise
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
## 2022-05-28 ENCOUNTER — Ambulatory Visit: Payer: Commercial Managed Care - HMO | Admitting: Cardiology

## 2022-05-29 ENCOUNTER — Telehealth: Payer: Self-pay | Admitting: Cardiology

## 2022-05-29 NOTE — Telephone Encounter (Signed)
Pt states he has been  sweating very bad in his sleep. He states he could not sleep well last night because he was drenched in sweat and that is the only symptom he is having. He would like Dr. Campbell Lerner advice.

## 2022-05-29 NOTE — Telephone Encounter (Signed)
Left message for pt to call.

## 2022-05-30 NOTE — Telephone Encounter (Signed)
Patient states he just started 2 nights ago with sweating at night when sleeping.  Pillow case is "soaking wet". No other symptoms other than sweating.  Continues meds as ordered. No smoking while on the patch. No other changes or anything different, no increased stress. Advised in the meantime to sleep with cool covers, light clothing if necessary, keeping room cooler bud no heavy blanket if too cool. Advised will send to the provider for any recommendations.

## 2022-05-31 NOTE — Telephone Encounter (Signed)
Unfortunately he was a no-show for his appointment this week, will get him rescheduled

## 2022-05-31 NOTE — Telephone Encounter (Signed)
Spoke to patient-he reports symptoms resolved.    Patient no showed appt 4/15 Appt rescheduled for 5/2

## 2022-06-14 ENCOUNTER — Ambulatory Visit: Payer: Commercial Managed Care - HMO | Attending: Cardiology | Admitting: Cardiology
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
## 2022-06-19 ENCOUNTER — Other Ambulatory Visit: Payer: Self-pay | Admitting: Pharmacist

## 2022-06-19 NOTE — Progress Notes (Signed)
   Grant Huff 03-05-1965 [id number]  Patient outreached by Buddy Duty, PharmD Candidate on 06/19/2022 while they were picking up prescriptions at [facility].  Noted patient is due for refills on Metoprolol succinate, Atorvastatin, and Hydrochlorothiazide.  Blood Pressure Readings: Last documented ambulatory systolic blood pressure: 134 Last documented ambulatory diastolic blood pressure: 95 Does the patient have a validated home blood pressure machine?: Yes They report home readings Normal, but does not remember the specific numbers.  Medication review was performed. Is the patient taking their medications as prescribed?: Yes   The following barriers to adherence were noted: Does the patient have cost concerns?: No Does the patient have transportation concerns?: No Does the patient need assistance obtaining refills?: No Does the patient occassionally forget to take some of their prescribed medications?: No Does the patient feel like one/some of their medications make them feel poorly?: No Does the patient have questions or concerns about their medications?: No Does the patient have a follow up scheduled with their primary care provider/cardiologist?: Yes   Interventions: Interventions Completed: Medications were reviewed  The patient has follow up scheduled:  PCP: Carney Living, MD   Catie Clearance Coots, RPH-CPP

## 2022-06-20 ENCOUNTER — Ambulatory Visit: Payer: Commercial Managed Care - HMO | Admitting: Family Medicine

## 2022-06-20 ENCOUNTER — Ambulatory Visit: Payer: Commercial Managed Care - HMO | Admitting: Pharmacist

## 2022-06-20 DIAGNOSIS — R7303 Prediabetes: Secondary | ICD-10-CM | POA: Insufficient documentation

## 2022-06-27 ENCOUNTER — Other Ambulatory Visit (HOSPITAL_COMMUNITY): Payer: Commercial Managed Care - HMO

## 2022-07-03 ENCOUNTER — Ambulatory Visit: Payer: Commercial Managed Care - HMO | Admitting: Cardiology

## 2022-07-03 ENCOUNTER — Ambulatory Visit: Payer: Commercial Managed Care - HMO | Attending: Cardiology | Admitting: Cardiology
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
## 2022-08-20 ENCOUNTER — Telehealth (HOSPITAL_COMMUNITY): Payer: Self-pay | Admitting: Emergency Medicine

## 2022-08-20 NOTE — Telephone Encounter (Signed)
Left message - calling to inform patient that because he had the catheterization done , he no longer needs the cardiac PET scan for an ischemic evaluation.   Will explain further to the patient if / when he calls back.  Rockwell Alexandria RN Navigator Cardiac Imaging [facility] Vascular Services [phone] Office  [phone] Cell

## 2022-08-22 ENCOUNTER — Ambulatory Visit (HOSPITAL_COMMUNITY): Admission: RE | Admit: 2022-08-22 | Payer: Commercial Managed Care - HMO | Source: Ambulatory Visit

## 2022-08-22 ENCOUNTER — Encounter (HOSPITAL_COMMUNITY): Payer: Self-pay

## 2022-08-23 ENCOUNTER — Ambulatory Visit: Payer: Commercial Managed Care - HMO | Admitting: Pharmacist

## 2022-10-30 ENCOUNTER — Telehealth: Payer: Self-pay | Admitting: Pharmacist

## 2022-10-30 NOTE — Telephone Encounter (Signed)
Attempted to contact patient for follow-up of blood pressure/hypertension control.   Patient's spouse answered the call. Requested to relay to patient to call back to direct phone: [phone] for scheduling blood pressure visit with clinic pharmacist.   Total time with patient call and documentation of interaction: 4 minutes.

## 2022-11-05 ENCOUNTER — Other Ambulatory Visit: Payer: Self-pay | Admitting: Family Medicine

## 2022-12-26 ENCOUNTER — Other Ambulatory Visit: Payer: Self-pay | Admitting: Family Medicine

## 2022-12-28 ENCOUNTER — Other Ambulatory Visit: Payer: Self-pay

## 2023-01-04 ENCOUNTER — Encounter: Payer: Self-pay | Admitting: Family Medicine

## 2023-03-15 ENCOUNTER — Ambulatory Visit (INDEPENDENT_AMBULATORY_CARE_PROVIDER_SITE_OTHER): Payer: Commercial Managed Care - HMO | Admitting: Family Medicine

## 2023-03-15 ENCOUNTER — Encounter: Payer: Self-pay | Admitting: Family Medicine

## 2023-03-15 ENCOUNTER — Telehealth: Payer: Self-pay | Admitting: Family Medicine

## 2023-03-15 VITALS — BP 172/99 | HR 70 | Ht 71.0 in | Wt 227.4 lb

## 2023-03-15 DIAGNOSIS — I10 Essential (primary) hypertension: Secondary | ICD-10-CM

## 2023-03-15 DIAGNOSIS — R7309 Other abnormal glucose: Secondary | ICD-10-CM | POA: Diagnosis not present

## 2023-03-15 DIAGNOSIS — Z122 Encounter for screening for malignant neoplasm of respiratory organs: Secondary | ICD-10-CM

## 2023-03-15 DIAGNOSIS — R062 Wheezing: Secondary | ICD-10-CM

## 2023-03-15 DIAGNOSIS — Z1211 Encounter for screening for malignant neoplasm of colon: Secondary | ICD-10-CM

## 2023-03-15 DIAGNOSIS — N529 Male erectile dysfunction, unspecified: Secondary | ICD-10-CM

## 2023-03-15 DIAGNOSIS — R7303 Prediabetes: Secondary | ICD-10-CM

## 2023-03-15 DIAGNOSIS — Z0000 Encounter for general adult medical examination without abnormal findings: Secondary | ICD-10-CM | POA: Diagnosis not present

## 2023-03-15 LAB — POCT GLYCOSYLATED HEMOGLOBIN (HGB A1C): Hemoglobin A1C: 5.6 % (ref 4.0–5.6)

## 2023-03-15 MED ORDER — ATORVASTATIN CALCIUM 80 MG PO TABS: 80.0000 mg | ORAL_TABLET | Freq: Every day | ORAL | 1 refills | Status: DC

## 2023-03-15 MED ORDER — LOSARTAN POTASSIUM 25 MG PO TABS: 25.0000 mg | ORAL_TABLET | Freq: Every day | ORAL | 1 refills | Status: DC

## 2023-03-15 MED ORDER — ASPIRIN 81 MG PO TBEC: 81.0000 mg | DELAYED_RELEASE_TABLET | Freq: Every day | ORAL | 1 refills | Status: DC

## 2023-03-15 MED ORDER — NITROGLYCERIN 0.4 MG SL SUBL: 0.4000 mg | SUBLINGUAL_TABLET | SUBLINGUAL | 3 refills | Status: DC | PRN

## 2023-03-15 MED ORDER — METOPROLOL SUCCINATE ER 25 MG PO TB24: 25.0000 mg | ORAL_TABLET | Freq: Every day | ORAL | 1 refills | Status: DC

## 2023-03-15 MED ORDER — ALBUTEROL SULFATE HFA 108 (90 BASE) MCG/ACT IN AERS
2.0000 | INHALATION_SPRAY | Freq: Four times a day (QID) | RESPIRATORY_TRACT | 2 refills | Status: DC | PRN
Start: 2023-03-15 — End: 2023-11-07

## 2023-03-15 MED ORDER — NICOTINE 14 MG/24HR TD PT24: MEDICATED_PATCH | TRANSDERMAL | 1 refills | Status: DC

## 2023-03-15 MED ORDER — HYDROCHLOROTHIAZIDE 25 MG PO TABS: 25.0000 mg | ORAL_TABLET | Freq: Every day | ORAL | 1 refills | Status: DC

## 2023-03-15 MED ORDER — SERTRALINE HCL 100 MG PO TABS: 100.0000 mg | ORAL_TABLET | Freq: Every day | ORAL | 1 refills | Status: DC

## 2023-03-15 NOTE — Telephone Encounter (Signed)
HIPAA compliant callback message left.  Please advise: A1C in the prediabetic range. We will work on lifestyle modification. I did not fill his Viagra since he is also on Nitroglycerine. I need to go over both meds with him prior to refill.  I will contact him soon with test results.  Someone will call from our office to schedule his lung cancer screening CT scan. He will receive a call from GI's office for colon cancer screen.

## 2023-03-15 NOTE — Patient Instructions (Signed)
Pneumococcal Polysaccharide Vaccine (PPSV23) Injection What is this medication? PNEUMOCOCCAL POLYSACCHARIDE VACCINE (NEU mo KOK al pol ee SAK ar ide vak SEEN) reduces the risk of pneumococcal disease, such as pneumonia. It does not treat pneumococcal disease. It is still possible to get pneumococcal disease after receiving this vaccine, but the symptoms may be less severe or not last as long. It works by helping your immune system learn how to fight off a future infection. This medicine may be used for other purposes; ask your health care provider or pharmacist if you have questions. COMMON BRAND NAME(S): Pneumovax 23, Pneumovax-23 What should I tell my care team before I take this medication? They need to know if you have any of these conditions: Bleeding problems Bone marrow or organ transplant Cancer, Hodgkin's disease Fever Immune system problems Infection Low platelet count in the blood Seizures An unusual or allergic reaction to pneumococcal vaccine, diphtheria toxoid, other vaccines, latex, other medications, foods, dyes, or preservatives Pregnant or trying to get pregnant Breastfeeding How should I use this medication? This vaccine is injected into a muscle or under the skin. It is given by your care team. A copy of Vaccine Information Statements will be given before each vaccination. Be sure to read this information carefully each time. This sheet may change often. Talk to your care team about the use of this medication in children. While it may be prescribed for children as young as 2 years for selected conditions, precautions do apply. Overdosage: If you think you have taken too much of this medicine contact a poison control center or emergency room at once. NOTE: This medicine is only for you. Do not share this medicine with others. What if I miss a dose? This does not apply. This medication is not for regular use. What may interact with this medication? Certain medications  that prevent or treat blood clots, such as warfarin, enoxaparin, dalteparin Medications for cancer chemotherapy Medications that suppress your immune function Steroid medications, such as prednisone or cortisone This list may not describe all possible interactions. Give your health care provider a list of all the medicines, herbs, non-prescription drugs, or dietary supplements you use. Also tell them if you smoke, drink alcohol, or use illegal drugs. Some items may interact with your medicine. What should I watch for while using this medication? Visit your care team regularly. Report any side effects to your care team right away. This vaccine, like all vaccines, may not fully protect everyone. What side effects may I notice from receiving this medication? Side effects that you should report to your care team as soon as possible: Allergic reactions--skin rash, itching, hives, swelling of the face, lips, tongue, or throat Side effects that usually do not require medical attention (report these to your care team if they continue or are bothersome): Chills Fever Headache Muscle pain Pain, redness, or irritation at injection site Unusual weakness or fatigue This list may not describe all possible side effects. Call your doctor for medical advice about side effects. You may report side effects to FDA at 1-800-FDA-1088. Where should I keep my medication? This vaccine is only given by your care team. It will not be stored at home. NOTE: This sheet is a summary. It may not cover all possible information. If you have questions about this medicine, talk to your doctor, pharmacist, or health care provider.  2024 Elsevier/Gold Standard (2021-07-12 00:00:00)

## 2023-03-15 NOTE — Progress Notes (Signed)
    SUBJECTIVE:   Chief compliant/HPI: annual examination  Grant Huff is a 58 y.o. who presents today for an annual exam.   Reviewed and updated history Yes.   Review of systems form notable for Here for medication refill. Off all meds for months. BP running high since he is out of meds. He feels well today but endorsed occasional  headache and light headedness. No chest pain, he occasional SOB. Hard to catch his breath sometimes. Coughs here and there.  Smoking - He smokes since age 29. Used Nicotine patch in the past which helped, he need patch refill.  OBJECTIVE:   BP (!) 172/99   Pulse 70   Ht 5\' 11"  (1.803 m)   Wt 227 lb 6.4 oz (103.1 kg)   SpO2 99%   BMI 31.72 kg/m   Physical Exam Vitals and nursing note reviewed.  Constitutional:      Appearance: Normal appearance. He is not ill-appearing.  Eyes:     Extraocular Movements: Extraocular movements intact.     Pupils: Pupils are equal, round, and reactive to light.  Cardiovascular:     Rate and Rhythm: Normal rate and regular rhythm.     Heart sounds: Normal heart sounds. No murmur heard.    No friction rub.  Pulmonary:     Effort: Pulmonary effort is normal. No respiratory distress.     Breath sounds: Normal breath sounds. No wheezing.  Neurological:     General: No focal deficit present.     Mental Status: He is oriented to person, place, and time.  Psychiatric:        Mood and Affect: Mood normal.        Behavior: Behavior normal.      ASSESSMENT/PLAN:   No problem-specific Assessment & Plan notes found for this encounter.    Annual Examination  See AVS for age appropriate recommendations  PHQ score 0, reviewed and discussed.  Blood pressure reviewed and at goal. Not at goal   Advanced directive Not discussed today   Considered the following items based upon USPSTF recommendations: HIV testing: not indicated Hepatitis C: not indicated Hepatitis B: not indicated Syphilis if at high risk: {not  indicated GC/CTnot indicated Lipid panel (nonfasting or fasting) discussed based upon AHA recommendations and ordered.  Consider repeat every 4-6 years.  Reviewed risk factors for latent tuberculosis and not indicated Immunizations He declined Covid-19, flu shot, tdap and PCV20 - counseling provided  Referred to GI for colon cancer screen Discussed need for lung cancer screen as well given his smoking hx. He is interested in screening. Referral placed  I refilled his medications. Return in 4 weeks for BP management Lab done today He agreed with plan.  Follow up in 1 year or sooner if indicated.    Janit Pagan, MD [facility]

## 2023-03-16 LAB — BASIC METABOLIC PANEL
BUN/Creatinine Ratio: 9 (ref 9–20)
BUN: 11 mg/dL (ref 6–24)
CO2: 24 mmol/L (ref 20–29)
Calcium: 9.5 mg/dL (ref 8.7–10.2)
Chloride: 105 mmol/L (ref 96–106)
Creatinine, Ser: 1.2 mg/dL (ref 0.76–1.27)
Glucose: 89 mg/dL (ref 70–99)
Potassium: 4.4 mmol/L (ref 3.5–5.2)
Sodium: 142 mmol/L (ref 134–144)
eGFR: 70 mL/min/{1.73_m2} (ref >59)

## 2023-03-16 LAB — LIPID PANEL
Chol/HDL Ratio: 3.2 {ratio} (ref 0.0–5.0)
Cholesterol, Total: 256 mg/dL — ABNORMAL HIGH (ref 100–199)
HDL: 79 mg/dL (ref >39)
LDL Chol Calc (NIH): 134 mg/dL — ABNORMAL HIGH (ref 0–99)
Triglycerides: 244 mg/dL — ABNORMAL HIGH (ref 0–149)
VLDL Cholesterol Cal: 43 mg/dL — ABNORMAL HIGH (ref 5–40)

## 2023-03-16 NOTE — Telephone Encounter (Signed)
Unable to reach with test result. Wife picked up home number call. I advised I will reach out again on Monday or he can call to discuss results. Did not disclose any information to wife.   FLP abnormal - he has been of Statin for months. Meds already refilled. He need to get back on his med.

## 2023-04-02 ENCOUNTER — Encounter: Payer: Self-pay | Admitting: Family Medicine

## 2023-04-02 DIAGNOSIS — I5020 Unspecified systolic (congestive) heart failure: Secondary | ICD-10-CM | POA: Insufficient documentation

## 2023-04-09 ENCOUNTER — Inpatient Hospital Stay: Admission: RE | Admit: 2023-04-09 | Payer: Commercial Managed Care - HMO | Source: Ambulatory Visit

## 2023-04-26 ENCOUNTER — Ambulatory Visit: Payer: Commercial Managed Care - HMO | Admitting: Family Medicine

## 2023-06-29 ENCOUNTER — Other Ambulatory Visit: Payer: Self-pay | Admitting: Cardiology

## 2023-06-29 ENCOUNTER — Other Ambulatory Visit: Payer: Self-pay | Admitting: Family Medicine

## 2023-07-01 ENCOUNTER — Other Ambulatory Visit: Payer: Self-pay

## 2023-07-01 MED ORDER — ASPIRIN 81 MG PO TBEC: 81.0000 mg | DELAYED_RELEASE_TABLET | Freq: Every day | ORAL | 1 refills | Status: DC

## 2023-07-02 ENCOUNTER — Telehealth: Payer: Self-pay

## 2023-07-02 NOTE — Telephone Encounter (Signed)
Error. Grant Huff, CMA

## 2023-07-05 ENCOUNTER — Ambulatory Visit (INDEPENDENT_AMBULATORY_CARE_PROVIDER_SITE_OTHER): Admitting: Student

## 2023-07-05 ENCOUNTER — Encounter: Payer: Self-pay | Admitting: Student

## 2023-07-05 VITALS — BP 118/72 | HR 69 | Ht 71.0 in | Wt 212.2 lb

## 2023-07-05 DIAGNOSIS — Z720 Tobacco use: Secondary | ICD-10-CM

## 2023-07-05 DIAGNOSIS — Z1211 Encounter for screening for malignant neoplasm of colon: Secondary | ICD-10-CM | POA: Diagnosis not present

## 2023-07-05 DIAGNOSIS — Z0000 Encounter for general adult medical examination without abnormal findings: Secondary | ICD-10-CM | POA: Diagnosis not present

## 2023-07-05 DIAGNOSIS — I5020 Unspecified systolic (congestive) heart failure: Secondary | ICD-10-CM | POA: Diagnosis not present

## 2023-07-05 DIAGNOSIS — I2510 Atherosclerotic heart disease of native coronary artery without angina pectoris: Secondary | ICD-10-CM

## 2023-07-05 MED ORDER — NICOTINE 14 MG/24HR TD PT24: MEDICATED_PATCH | TRANSDERMAL | 1 refills | Status: DC

## 2023-07-05 MED ORDER — ASPIRIN 81 MG PO TBEC: 81.0000 mg | DELAYED_RELEASE_TABLET | Freq: Every day | ORAL | 1 refills | Status: DC

## 2023-07-05 NOTE — Patient Instructions (Addendum)
 It was great to see you! Thank you for allowing me to participate in your care!  I recommend that you always bring your medications to each appointment as this makes it easy to ensure you are on the correct medications and helps us  not miss when refills are needed.  Our plans for today:  - Call to schedule apt with cardiology Address: [address], [place], [place] [address] Phone: [phone] - Return to discuss smoking cessation and erectile dysfunction if desired - Go to CT scan for lung cancer screening - You were referred to GI for colonoscopy, they will call to schedule  We are checking some labs today, I will call you if they are abnormal will send you a MyChart message or a letter if they are normal.  If you do not hear about your labs in the next 2 weeks please let us  know.  Take care and seek immediate care sooner if you develop any concerns.   Dr. Glenn Lange, DO [facility]

## 2023-07-05 NOTE — Assessment & Plan Note (Addendum)
 Aspirin  refilled which she takes for CAD and he was provided with cardiology contact information to schedule routine follow-up appointment.  He is euvolemic on exam today.  - Continue aspirin , atorvastatin , losartan , metoprolol  and follow-up with cardiology

## 2023-07-05 NOTE — Assessment & Plan Note (Signed)
 Due for lung cancer screening due to smoking history which was scheduled.  Nicotine  patches refilled and smoking cessation encouraged.

## 2023-07-05 NOTE — Progress Notes (Signed)
    SUBJECTIVE:   CHIEF COMPLAINT / HPI:   Grant Huff is a 58 year old male with coronary artery disease who presents for medication review, routine health maintenance and annual exam.  He has no complaints today. He is down to half a pack of cigarettes a day.  He has not followed up with his cardiologist for a year and missed his last appointment.  He is due for lung cancer screening due to a smoking history of over 20 years, with a low dose CT scan ordered in January 2025 which has not yet been done.  He would like to check PSA.  He is interested in a referral for a colonoscopy.    PERTINENT  PMH / PSH: HTN, CAD, tobacco use, prediabetes, systolic CHF  OBJECTIVE:   BP 829/56   Pulse 69   Ht 5\' 11"  (1.803 m)   Wt 212 lb 3.2 oz (96.3 kg)   SpO2 95%   BMI 29.60 kg/m    General: NAD, pleasant, well-appearing Cardiac: RRR, no murmurs. Respiratory: CTAB, normal effort, No wheezes, rales or rhonchi Extremities: no edema or cyanosis. Skin: warm and dry Neuro: alert, no obvious focal deficits Psych: Normal affect and mood  ASSESSMENT/PLAN:   Assessment & Plan Tobacco abuse Due for lung cancer screening due to smoking history which was scheduled.  Nicotine  patches refilled and smoking cessation encouraged. Systolic congestive heart failure, unspecified HF chronicity (HCC) Aspirin  refilled which she takes for CAD and he was provided with cardiology contact information to schedule routine follow-up appointment.  He is euvolemic on exam today.  - Continue aspirin , atorvastatin , losartan , metoprolol  and follow-up with cardiology   Health maintenance Discussed prostate cancer screening with PSA test which was ordered.  Colonoscopy for colon cancer screening discussed and agreed upon, referral to GI placed.   Declined shingles vaccine    Dr. Glenn Lange, DO [facility]

## 2023-07-06 LAB — PSA: Prostate Specific Ag, Serum: 0.3 ng/mL (ref 0.0–4.0)

## 2023-07-08 ENCOUNTER — Ambulatory Visit: Payer: Self-pay | Admitting: Student

## 2023-07-09 ENCOUNTER — Telehealth: Payer: Self-pay

## 2023-07-09 NOTE — Telephone Encounter (Signed)
 RECEIVED FAX FROM [facility] FOR CLOPIDOGREL  75MG  TABLETS. I DON'T SEE THIS ON CURRENT MED LIST. IF YOU WANT PATIENT TO START THIS MEDICATION PLEASE SEND RX TO PHARMACY WITH DIRECTIONS, DOSE AND DURATION.    THANK YOU Linnie Riches, CMA

## 2023-07-11 ENCOUNTER — Telehealth: Payer: Self-pay | Admitting: Family Medicine

## 2023-07-11 NOTE — Telephone Encounter (Signed)
 I received a secure chat message from Vanda Trogdon - please sign order in epic for cinque, begley [birth year] scheduled for tomorrow 07/12/23 at 5:30. I called and spoke with Annis Baseman. She stated that she would send the order for cosigning.  I am not clear what the issue is with the initial. However, I will sign once I receive it in my inbox.

## 2023-07-12 ENCOUNTER — Ambulatory Visit (HOSPITAL_COMMUNITY): Admission: RE | Admit: 2023-07-12 | Source: Ambulatory Visit

## 2023-09-23 ENCOUNTER — Other Ambulatory Visit: Payer: Self-pay | Admitting: Family Medicine

## 2023-10-24 ENCOUNTER — Other Ambulatory Visit: Payer: Self-pay | Admitting: Family Medicine

## 2023-11-07 ENCOUNTER — Ambulatory Visit (INDEPENDENT_AMBULATORY_CARE_PROVIDER_SITE_OTHER): Admitting: Student

## 2023-11-07 VITALS — BP 138/80 | HR 64 | Ht 71.0 in | Wt 205.0 lb

## 2023-11-07 DIAGNOSIS — R062 Wheezing: Secondary | ICD-10-CM

## 2023-11-07 DIAGNOSIS — Z720 Tobacco use: Secondary | ICD-10-CM | POA: Diagnosis not present

## 2023-11-07 DIAGNOSIS — I2510 Atherosclerotic heart disease of native coronary artery without angina pectoris: Secondary | ICD-10-CM | POA: Diagnosis not present

## 2023-11-07 DIAGNOSIS — T63441A Toxic effect of venom of bees, accidental (unintentional), initial encounter: Secondary | ICD-10-CM | POA: Insufficient documentation

## 2023-11-07 DIAGNOSIS — T63444S Toxic effect of venom of bees, undetermined, sequela: Secondary | ICD-10-CM

## 2023-11-07 DIAGNOSIS — N529 Male erectile dysfunction, unspecified: Secondary | ICD-10-CM | POA: Diagnosis not present

## 2023-11-07 DIAGNOSIS — I10 Essential (primary) hypertension: Secondary | ICD-10-CM

## 2023-11-07 MED ORDER — NITROGLYCERIN 0.4 MG SL SUBL: 0.4000 mg | SUBLINGUAL_TABLET | SUBLINGUAL | 3 refills | Status: AC | PRN

## 2023-11-07 MED ORDER — SERTRALINE HCL 100 MG PO TABS: 100.0000 mg | ORAL_TABLET | Freq: Every day | ORAL | 8 refills | Status: AC

## 2023-11-07 MED ORDER — SILDENAFIL CITRATE 100 MG PO TABS: 100.0000 mg | ORAL_TABLET | Freq: Every day | ORAL | 0 refills | Status: AC | PRN

## 2023-11-07 MED ORDER — EPINEPHRINE 0.3 MG/0.3ML IJ SOAJ: 0.3000 mg | INTRAMUSCULAR | 3 refills | Status: AC | PRN

## 2023-11-07 MED ORDER — ASPIRIN 81 MG PO TBEC: 81.0000 mg | DELAYED_RELEASE_TABLET | Freq: Every day | ORAL | 1 refills | Status: AC

## 2023-11-07 MED ORDER — ALBUTEROL SULFATE HFA 108 (90 BASE) MCG/ACT IN AERS: 2.0000 | INHALATION_SPRAY | Freq: Four times a day (QID) | RESPIRATORY_TRACT | 2 refills | Status: AC | PRN

## 2023-11-07 MED ORDER — LOSARTAN POTASSIUM 25 MG PO TABS: 25.0000 mg | ORAL_TABLET | Freq: Every day | ORAL | 0 refills | Status: AC

## 2023-11-07 MED ORDER — HYDROCHLOROTHIAZIDE 25 MG PO TABS: 25.0000 mg | ORAL_TABLET | Freq: Every day | ORAL | 1 refills | Status: AC

## 2023-11-07 MED ORDER — ATORVASTATIN CALCIUM 80 MG PO TABS: 80.0000 mg | ORAL_TABLET | Freq: Every day | ORAL | 1 refills | Status: AC

## 2023-11-07 MED ORDER — METOPROLOL SUCCINATE ER 25 MG PO TB24: 25.0000 mg | ORAL_TABLET | Freq: Every day | ORAL | 8 refills | Status: AC

## 2023-11-07 MED ORDER — NICOTINE 14 MG/24HR TD PT24: MEDICATED_PATCH | TRANSDERMAL | 1 refills | Status: AC

## 2023-11-07 NOTE — Assessment & Plan Note (Signed)
 Has history of anaphylaxis reaction with bee stings.  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 - Rx EpiPen 

## 2023-11-07 NOTE — Patient Instructions (Signed)
 Pleasure to see you today.  Your blood pressure today was elevated which I suspect is because significant blood pressure medications.  I have refilled your medications including any of Viagra .  Please make sure to check your blood pressures for the next 2 weeks and if it is consistently below 140/90 please return so that we can look into adjusting your blood pressure medications.  I have also sent in prescription for your EpiPen .

## 2023-11-07 NOTE — Progress Notes (Signed)
    SUBJECTIVE:   CHIEF COMPLAINT / HPI:   58 year old male with history of hypertension Presenting today for blood pressure management Patient's current medication includes losartan , HCTZ and metoprolol  Patient reports he has been out of his medication for about 1 month Does not check home blood pressure Reports occasional headache mostly at work Describes the headache as frontal and around, headaches does not wake him up,  Denies associated vision changes.  No chest pain or shortness of breath  PERTINENT  PMH / PSH: Reviewed  OBJECTIVE:   BP 138/80   Pulse 64   Ht 5' 11 (1.803 m)   Wt 205 lb (93 kg)   SpO2 95%   BMI 28.59 kg/m    Physical Exam General: Alert, well appearing, NAD Cardiovascular: RRR, No Murmurs, Normal S2/S2 Respiratory: CTAB, No wheezing or Rales Abdomen: No distension or tenderness Extremities: No edema on extremities     ASSESSMENT/PLAN:   Essential hypertension BP today slightly elevated.  Suspect due to patient being out of his medication for about a month.  Currently asymptomatic but reports occasional headaches that are consistent with tension type headache. - Refilled blood pressure medications - Encourage patient to continue home blood pressure readings - Follow-up as needed  ED (erectile dysfunction) Refilled his home Viagra .  Counseled patient on use precaution given also as needed nitroglycerin  patch.  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 - Rx EpiPen     Norleen April, MD [facility]

## 2023-11-07 NOTE — Assessment & Plan Note (Signed)
 Refilled his home Viagra .  Counseled patient on use precaution given also as needed nitroglycerin  patch.

## 2023-11-07 NOTE — Assessment & Plan Note (Addendum)
 BP today slightly elevated.  Suspect due to patient being out of his medication for about a month.  Currently asymptomatic but reports occasional headaches that are consistent with tension type headache. - Refilled blood pressure medications - Encourage patient to continue home blood pressure readings - Follow-up as needed

## 2024-02-15 ENCOUNTER — Emergency Department (HOSPITAL_COMMUNITY)
Admission: EM | Admit: 2024-02-15 | Discharge: 2024-02-15 | Discharge status: Against Medical Advice | Source: Home / Self Care

## 2024-02-15 ENCOUNTER — Encounter (HOSPITAL_COMMUNITY): Payer: Self-pay

## 2024-02-15 ENCOUNTER — Emergency Department (HOSPITAL_COMMUNITY)

## 2024-02-15 ENCOUNTER — Other Ambulatory Visit: Payer: Self-pay

## 2024-02-15 DIAGNOSIS — R1010 Upper abdominal pain, unspecified: Secondary | ICD-10-CM | POA: Insufficient documentation

## 2024-02-15 DIAGNOSIS — Z5321 Procedure and treatment not carried out due to patient leaving prior to being seen by health care provider: Secondary | ICD-10-CM | POA: Insufficient documentation

## 2024-02-15 DIAGNOSIS — R079 Chest pain, unspecified: Secondary | ICD-10-CM | POA: Diagnosis not present

## 2024-02-15 DIAGNOSIS — R509 Fever, unspecified: Secondary | ICD-10-CM | POA: Insufficient documentation

## 2024-02-15 LAB — BASIC METABOLIC PANEL WITH GFR
Anion gap: 14 (ref 5–15)
BUN: 12 mg/dL (ref 6–20)
CO2: 21 mmol/L — ABNORMAL LOW (ref 22–32)
Calcium: 9.2 mg/dL (ref 8.9–10.3)
Chloride: 102 mmol/L (ref 98–111)
Creatinine, Ser: 1.21 mg/dL (ref 0.61–1.24)
GFR, Estimated: >60 mL/min
Glucose, Bld: 135 mg/dL — ABNORMAL HIGH (ref 70–99)
Potassium: 3.6 mmol/L (ref 3.5–5.1)
Sodium: 136 mmol/L (ref 135–145)

## 2024-02-15 LAB — CBC
HCT: 45.3 % (ref 39.0–52.0)
Hemoglobin: 15.4 g/dL (ref 13.0–17.0)
MCH: 33.5 pg (ref 26.0–34.0)
MCHC: 34 g/dL (ref 30.0–36.0)
MCV: 98.5 fL (ref 80.0–100.0)
Platelets: 243 K/uL (ref 150–400)
RBC: 4.6 MIL/uL (ref 4.22–5.81)
RDW: 14.6 % (ref 11.5–15.5)
WBC: 8.2 K/uL (ref 4.0–10.5)
nRBC: 0 % (ref 0.0–0.2)

## 2024-02-15 LAB — HEPATIC FUNCTION PANEL
ALT: 31 U/L (ref 0–44)
AST: 38 U/L (ref 15–41)
Albumin: 4.5 g/dL (ref 3.5–5.0)
Alkaline Phosphatase: 80 U/L (ref 38–126)
Bilirubin, Direct: 0.3 mg/dL — ABNORMAL HIGH (ref 0.0–0.2)
Indirect Bilirubin: 0.6 mg/dL (ref 0.3–0.9)
Total Bilirubin: 0.9 mg/dL (ref 0.0–1.2)
Total Protein: 7 g/dL (ref 6.5–8.1)

## 2024-02-15 LAB — TROPONIN T, HIGH SENSITIVITY: Troponin T High Sensitivity: 17 ng/L (ref 0–19)

## 2024-02-15 MED ORDER — ASPIRIN 81 MG PO CHEW
243.0000 mg | CHEWABLE_TABLET | Freq: Once | ORAL | Status: AC
  Administered 2024-02-15: 243 mg via ORAL
  Filled 2024-02-15: qty 3

## 2024-02-15 NOTE — ED Notes (Signed)
Pt called x2 for vitals recheck. Pt could not be found.

## 2024-02-15 NOTE — ED Triage Notes (Signed)
 Per family member patient c/o chest pain onset last pm , states he has a cardiac history however when trying to talk to the patient he is very rude and told me to look up his medical record

## 2024-02-15 NOTE — ED Triage Notes (Signed)
 He has had 81mg  of aspirin  this morning.

## 2024-02-15 NOTE — ED Provider Triage Note (Signed)
 Emergency Medicine Provider Triage Evaluation Note  Grant Huff , a 59 y.o. male  was evaluated in triage.  Pt complains of upper abdominal pain/lower chest pain that started last night.  Reports pain feels crampy and fairly constant.  He also reports subjective fever and chills last night.  No chest pain specifically with exertion.  Mild shortness of breath.    Review of Systems  Positive: As above Negative: As above  Physical Exam  BP 129/80 (BP Location: Right Arm)   Pulse 88   Temp (!) 97.5 F (36.4 C)   Resp 17   Ht 5' 11 (1.803 m)   Wt 102.1 kg   SpO2 93%   BMI 31.38 kg/m  Gen:   Awake, appears uncomfortable and bent over, but no acute distress Resp:  Normal effort  MSK:   Moves extremities without difficulty  Other:  2+ radial pulse bilaterally  Medical Decision Making  Medically screening exam initiated at 1:34 PM.  Appropriate orders placed.  Grant Huff was informed that the remainder of the evaluation will be completed by another provider, this initial triage assessment does not replace that evaluation, and the importance of remaining in the ED until their evaluation is complete.  Patient already took 81 mg of aspirin  prior to arrival, will give to 243mg  additionally for full dose of 324 mg in case of cardiac etiology to his pain.   Veta Palma, PA-C 02/15/24 1334

## 2024-02-15 NOTE — ED Triage Notes (Signed)
 Patient states that he is having chest pain on the left side that feels like a cramp pain radiates to the right side. He states he has 3 stents that were placed about 5-6 years ago. He has been having this pain since this morning. He has his he has not taken any medications for the pain. He has a inhaler and his nitroglycerin  with him.

## 2024-02-15 NOTE — ED Notes (Signed)
 Pt called in lobby x 2 with no response. Moved OTF.

## 2024-02-15 NOTE — ED Notes (Signed)
 Pt called to go back to a room. Pt could not be found.
